# Patient Record
Sex: Male | Born: 1990 | Race: White | Hispanic: No | Marital: Single | Smoking: Current every day smoker
Health system: Southern US, Community
[De-identification: ages and names within clinical notes are randomized; demographics above are authoritative.]

## PROBLEM LIST (undated history)

## (undated) HISTORY — PX: FRACTURE SURGERY: SHX138

---
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
## 2010-08-04 NOTE — Consult Note (Signed)
[place]. [facility]  Patient:    Sergio Wright, Sergio Wright Visit Number: [id number] MRN: [id number]          Service Type: EXP Location: [facility] Attending Physician:  Doug Sou Dictated by:   Cammy Copa, M.D. Admit Date:  12/02/2000                            Consultation Report  CHIEF COMPLAINT:  Left wrist pain.  HISTORY OF PRESENT ILLNESS:  Sergio Wright is a 20-year-old child who was playing football tonight and he fell on his outstretched left wrist.  Had immediate onset of pain and swelling.  Presents now for operative management.  PAST MEDICAL HISTORY:  Noncontributory.  Patient did have a ______ fracture of the left wrist same time last year.  PAST SURGICAL HISTORY:  Noncontributory.  CURRENT MEDICATIONS:  None.  ALLERGIES:  No known drug allergies.  PHYSICAL EXAMINATION  GENERAL:  Patient is alert and oriented x 3.  CHEST:  Clear to auscultation.  HEART:  Regular rate and rhythm.  ABDOMEN:  Benign.  EXTREMITIES:  Left elbow and shoulder have full range of motion.  Left wrist has swelling and volarly angulated deformity to the wrist.  He has intact ______ function as well as intact radial pulse.  LABORATORIES:  Radiographs demonstrate 20 degree angulated distal radius fracture which is about 3 cm proximal to the growth plate.  IMPRESSION:  Dorsally angulated distal radius fracture.  PLAN:  Patient ate banana pudding at 8:30 tonight.  Currently it is 10:00.  I believe a conscious sedation would be unsafe at this time, therefore, I am going to put him into a long arm splint and plan for an elective reduction on Thursday.  I need to give the patient a prescription for Tylenol No. 3 with codeine.  I will see him back.  I will post him and I will call the father to let him know when and where the case will be done. Dictated by:   Cammy Copa, M.D. Attending Physician:  Doug Sou DD:  12/02/00 TD:  12/03/00 Job:  [id number]

## 2010-08-04 NOTE — Op Note (Signed)
[place]. [facility]  Patient:    MENA, LIENAU Visit Number: [id number] MRN: [id number]          Service Type: DSU Location: [facility] Attending Physician:  Burnard Bunting Dictated by:   Cammy Copa, M.D. Proc. Date: 12/05/00 Admit Date:  12/05/2000                             Operative Report  PREOPERATIVE DIAGNOSIS:  Left distal radius fracture, dorsally displaced.  POSTOPERATIVE DIAGNOSIS:  Left distal radius fracture, dorsally displaced.  PROCEDURE:  Closed reduction and casting of left distal radius fracture.  SURGEON:  Eric L. August Saucer, M.D.  ANESTHESIA:  General endotracheal.  ESTIMATED BLOOD LOSS:  None.  DESCRIPTION OF PROCEDURE:  Mr. Gaunt was brought to the operating room, where general anesthesia was induced.  The left wrist was then manipulated into better alignment.  The fracture was reduced to near-anatomic position. Preoperative angulation of 20 degrees was reduced to about 0 degrees.  The patient was then placed into a well-padded long-arm cast.  He was extubated and transferred to the recovery room in stable condition.  Fluoroscopy in the AP and lateral planes confirmed a good reduction. Dictated by:   Cammy Copa, M.D. Attending Physician:  Burnard Bunting DD:  12/05/00 TD:  12/05/00 Job: [id number]

## 2015-05-03 ENCOUNTER — Encounter: Payer: Self-pay | Admitting: Pediatrics

## 2015-05-03 ENCOUNTER — Ambulatory Visit (INDEPENDENT_AMBULATORY_CARE_PROVIDER_SITE_OTHER): Payer: Managed Care, Other (non HMO) | Admitting: Pediatrics

## 2015-05-03 VITALS — BP 134/89 | HR 87 | Temp 98.7°F | Ht 75.0 in | Wt 325.4 lb

## 2015-05-03 DIAGNOSIS — M545 Low back pain, unspecified: Secondary | ICD-10-CM

## 2015-05-03 DIAGNOSIS — R1030 Lower abdominal pain, unspecified: Secondary | ICD-10-CM | POA: Diagnosis not present

## 2015-05-03 DIAGNOSIS — R1031 Right lower quadrant pain: Secondary | ICD-10-CM

## 2015-05-03 DIAGNOSIS — Z6841 Body Mass Index (BMI) 40.0 and over, adult: Secondary | ICD-10-CM | POA: Insufficient documentation

## 2015-05-03 DIAGNOSIS — R1032 Left lower quadrant pain: Secondary | ICD-10-CM

## 2015-05-03 LAB — POCT UA - MICROSCOPIC ONLY
Bacteria, U Microscopic: NEGATIVE
Casts, Ur, LPF, POC: NEGATIVE
Crystals, Ur, HPF, POC: NEGATIVE
Epithelial cells, urine per micros: NEGATIVE
Mucus, UA: NEGATIVE
WBC, UR, HPF, POC: NEGATIVE
YEAST UA: NEGATIVE

## 2015-05-03 LAB — POCT URINALYSIS DIPSTICK
Bilirubin, UA: NEGATIVE
Glucose, UA: NEGATIVE
KETONES UA: NEGATIVE
LEUKOCYTES UA: NEGATIVE
Nitrite, UA: NEGATIVE
PH UA: 6
Spec Grav, UA: 1.02
Urobilinogen, UA: NEGATIVE

## 2015-05-03 MED ORDER — IBUPROFEN 800 MG PO TABS: 800.0000 mg | ORAL_TABLET | Freq: Three times a day (TID) | ORAL | Status: DC | PRN

## 2015-05-03 MED ORDER — CYCLOBENZAPRINE HCL 10 MG PO TABS: 10.0000 mg | ORAL_TABLET | Freq: Three times a day (TID) | ORAL | Status: DC | PRN

## 2015-05-03 NOTE — Progress Notes (Signed)
Subjective:    Patient ID: Sergio Wright, male    DOB: January 11, 1991, 25 y.o.   MRN: [id number]  CC: Back Pain and New Patient (Initial Visit)   HPI: Sergio Wright is a 25 y.o. 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, cutting logs, blowing leaves Has had back pain for past two days since then No pain going down legs Movement makes pain in back worse Always has some pressure in groin Pain mostly in the middle Pooping every day No incontinence No dysuria, no discharge Pain radiates down into groin No pain with peeing Appetite has been normal Minute clinic yesterday got muscle relaxers Lifting made it worse 800mg  mg ibuprofen three times a day Has noticed no difference past 24h with ibuprofen/muscle relaxant If he sits still has no pain, with movement has pain   Depression screen PHQ 2/9 05/03/2015  Decreased Interest 0  Down, Depressed, Hopeless 0  PHQ - 2 Score 0     Relevant past medical, surgical, family and social history reviewed and updated as indicated. Interim medical history since our last visit reviewed. Allergies and medications reviewed and updated.    ROS: All systems negative other than what is in HPI   Past Medical History Patient Active Problem List   Diagnosis Date Noted  . BMI 40.0-44.9, adult ([place]) 05/03/2015   Family History  Problem Relation Age of Onset  . Kidney Stones Father     Social History   Social History  . Marital Status: Single    Spouse Name: N/A  . Number of Children: N/A  . Years of Education: N/A   Occupational History  . Not on file.   Social History Main Topics  . Smoking status: Current Every Day Smoker -- 1.00 packs/day    Types: Cigarettes  . Smokeless tobacco: Not on file  . Alcohol Use: 7.2 oz/week    12 Cans of beer per week  . Drug Use: No  . Sexual Activity: Not on file   Other Topics Concern  . Not on file   Social History Narrative  . No narrative  on file     Current Outpatient Prescriptions  Medication Sig Dispense Refill  . ibuprofen (ADVIL,MOTRIN) 800 MG tablet     . orphenadrine (NORFLEX) 100 MG tablet      No current facility-administered medications for this visit.        Objective:    BP 134/89 mmHg  Pulse 87  Temp(Src) 98.7 F (37.1 C) (Oral)  Ht 6\' 3"  (1.905 m)  Wt 325 lb 6.4 oz (147.6 kg)  BMI 40.67 kg/m2  Wt Readings from Last 3 Encounters:  05/03/15 325 lb 6.4 oz (147.6 kg)     Gen: NAD, alert, cooperative with exam, NCAT EYES: EOMI, no scleral injection or icterus ENT:  OP without erythema LYMPH: no cervical LAD CV: NRRR, normal S1/S2, no murmur, distal pulses 2+ b/l Resp: CTABL, no wheezes, normal WOB Abd: +BS, soft, NTND. no guarding or rebound Ext: No edema, warm Neuro: Alert and oriented, strength equal b/l UE and LE, coordination grossly normal MSK: back pain, bilateral lower back, with twisting bilaterally, pain in back with slight forward flexion GU: normal ext male genitalia, testes hanging normal, mildly tender with palpation, no redness, no swelling, no palpable masses, no hernias     Assessment & Plan:    Phyllis was seen today for back pain and new patient (initial visit). Most  likely musculoskeletal pain. Gave back exercises, discussed symptomatic relief. Will test urine. Normal GU exam. If pain in testicles continues, will refer to urology. Not able to work with back pain as must be able to lift 50 lbs, wrote out of work until 05/13/2015 to give time for back pain to resolve.  Diagnoses and all orders for this visit:  Bilateral low back pain without sciatica -     ibuprofen (ADVIL,MOTRIN) 800 MG tablet; Take 1 tablet (800 mg total) by mouth every 8 (eight) hours as needed. -     cyclobenzaprine (FLEXERIL) 10 MG tablet; Take 1 tablet (10 mg total) by mouth 3 (three) times daily as needed for muscle spasms. -     POCT UA - Microscopic Only -     POCT urinalysis dipstick  Bilateral  groin pain -     POCT UA - Microscopic Only -     POCT urinalysis dipstick   Follow up plan: Return if symptoms worsen or fail to improve.  Assunta Found, MD [facility] 05/03/2015, 3:43 PM

## 2015-05-03 NOTE — Patient Instructions (Signed)

## 2015-05-06 ENCOUNTER — Telehealth: Payer: Self-pay | Admitting: Pediatrics

## 2015-05-06 NOTE — Telephone Encounter (Signed)
Patient aware of results.

## 2015-05-09 ENCOUNTER — Ambulatory Visit (INDEPENDENT_AMBULATORY_CARE_PROVIDER_SITE_OTHER): Payer: Managed Care, Other (non HMO) | Admitting: Pediatrics

## 2015-05-09 ENCOUNTER — Encounter: Payer: Self-pay | Admitting: Pediatrics

## 2015-05-09 VITALS — BP 135/84 | HR 106 | Temp 98.0°F | Ht 75.0 in | Wt 332.6 lb

## 2015-05-09 DIAGNOSIS — R1030 Lower abdominal pain, unspecified: Secondary | ICD-10-CM

## 2015-05-09 DIAGNOSIS — Z113 Encounter for screening for infections with a predominantly sexual mode of transmission: Secondary | ICD-10-CM | POA: Diagnosis not present

## 2015-05-09 DIAGNOSIS — R350 Frequency of micturition: Secondary | ICD-10-CM

## 2015-05-09 DIAGNOSIS — Z6841 Body Mass Index (BMI) 40.0 and over, adult: Secondary | ICD-10-CM

## 2015-05-09 LAB — POCT URINALYSIS DIPSTICK
BILIRUBIN UA: NEGATIVE
GLUCOSE UA: NEGATIVE
KETONES UA: NEGATIVE
Leukocytes, UA: NEGATIVE
Nitrite, UA: NEGATIVE
Protein, UA: NEGATIVE
RBC UA: NEGATIVE
Spec Grav, UA: 1.01
UROBILINOGEN UA: NEGATIVE
pH, UA: 7.5

## 2015-05-09 LAB — POCT UA - MICROSCOPIC ONLY
Bacteria, U Microscopic: NEGATIVE
CASTS, UR, LPF, POC: NEGATIVE
CRYSTALS, UR, HPF, POC: NEGATIVE
Epithelial cells, urine per micros: NEGATIVE
MUCUS UA: NEGATIVE
RBC, urine, microscopic: NEGATIVE
WBC, Ur, HPF, POC: NEGATIVE
YEAST UA: NEGATIVE

## 2015-05-09 LAB — POCT GLYCOSYLATED HEMOGLOBIN (HGB A1C): HEMOGLOBIN A1C: 5

## 2015-05-09 NOTE — Progress Notes (Signed)
Subjective:    Patient ID: Sergio Wright, male    DOB: Oct 24, 1990, 25 y.o.   MRN: [id number]  CC: Groin Pain   HPI: Sergio Wright is a 25 y.o. male presenting for Groin Pain  Back pain is better Noticing pressure in his bladder more Going to the bathroom more often, sometimes more than once in an hour This has been worsening since Saturday No new sexual partners for past 6 months Last STI screen was at 25yo No penile discharge, no dysuria, just the pressure in his bladder Drinks something and feels like he needs to urinate immediately   Depression screen [facility] 2/9 05/03/2015  Decreased Interest 0  Down, Depressed, Hopeless 0  PHQ - 2 Score 0     Relevant past medical, surgical, family and social history reviewed and updated as indicated. Interim medical history since our last visit reviewed. Allergies and medications reviewed and updated.    ROS: Per HPI unless specifically indicated above  History  Smoking status  . Current Every Day Smoker -- 1.00 packs/day  . Types: Cigarettes  Smokeless tobacco  . Not on file    Past Medical History Patient Active Problem List   Diagnosis Date Noted  . BMI 40.0-44.9, adult ([place]) 05/03/2015    Current Outpatient Prescriptions  Medication Sig Dispense Refill  . cyclobenzaprine (FLEXERIL) 10 MG tablet Take 1 tablet (10 mg total) by mouth 3 (three) times daily as needed for muscle spasms. 60 tablet 0  . ibuprofen (ADVIL,MOTRIN) 800 MG tablet Take 1 tablet (800 mg total) by mouth every 8 (eight) hours as needed. 60 tablet 0   No current facility-administered medications for this visit.       Objective:    BP 135/84 mmHg  Pulse 106  Temp(Src) 98 F (36.7 C) (Oral)  Ht _0  (1.905 m)  Wt 332 lb 9.6 oz (150.866 kg)  BMI 41.57 kg/m2  Wt Readings from Last 3 Encounters:  05/09/15 332 lb 9.6 oz (150.866 kg)  05/03/15 325 lb 6.4 oz (147.6 kg)     Gen: NAD, alert, cooperative with exam, NCAT EYES: EOMI, no scleral  injection or icterus ENT:  OP without erythema LYMPH: no cervical LAD CV: NRRR, normal S1/S2, no murmur, distal pulses 2+ b/l Resp: CTABL, no wheezes, normal WOB Abd: +BS, soft, NTND. no guarding or organomegaly, no CVA tenderness Ext: No edema, warm Neuro: Alert and oriented, strength equal b/l UE and LE, coordination grossly normal MSK: normal muscle bulk     Assessment & Plan:    Sergio Wright was seen today for groin pain and urinary frequency of unclear origin. Exam normal. Denies STI exposure recently, urine unrevealing. Will send below labs, refer to urology.  Diagnoses and all orders for this visit:  Groin pain, unspecified laterality -     POCT urinalysis dipstick -     POCT UA - Microscopic Only -     GC/Chlamydia Probe Amp -     RPR -     BMP8+EGFR -     HIV antibody -     Urine culture -     Ambulatory referral to Urology  Frequency of urination -     POCT urinalysis dipstick -     POCT UA - Microscopic Only -     GC/Chlamydia Probe Amp -     RPR -     BMP8+EGFR -     HIV antibody -     Urine culture -  POCT glycosylated hemoglobin (Hb A1C)  Routine screening for STI (sexually transmitted infection) -     GC/Chlamydia Probe Amp -     RPR -     HIV antibody  BMI 40.0-44.9, adult ([facility]) -     POCT glycosylated hemoglobin (Hb A1C)     Follow up plan: Return in about 4 weeks (around 06/06/2015).  Assunta Found, MD [facility] 05/09/2015, 3:41 PM

## 2015-05-10 LAB — HIV ANTIBODY (ROUTINE TESTING W REFLEX): HIV Screen 4th Generation wRfx: NONREACTIVE

## 2015-05-10 LAB — BMP8+EGFR
BUN/Creatinine Ratio: 13 (ref 8–19)
BUN: 11 mg/dL (ref 6–20)
CO2: 22 mmol/L (ref 18–29)
CREATININE: 0.83 mg/dL (ref 0.76–1.27)
Calcium: 9.9 mg/dL (ref 8.7–10.2)
Chloride: 98 mmol/L (ref 96–106)
GFR calc Af Amer: 142 mL/min/{1.73_m2} (ref >59)
GFR calc non Af Amer: 123 mL/min/{1.73_m2} (ref >59)
GLUCOSE: 90 mg/dL (ref 65–99)
POTASSIUM: 5 mmol/L (ref 3.5–5.2)
Sodium: 138 mmol/L (ref 134–144)

## 2015-05-10 LAB — RPR: RPR: NONREACTIVE

## 2015-05-11 LAB — GC/CHLAMYDIA PROBE AMP
Chlamydia trachomatis, NAA: NEGATIVE
NEISSERIA GONORRHOEAE BY PCR: NEGATIVE

## 2015-05-12 LAB — URINE CULTURE

## 2015-05-14 ENCOUNTER — Encounter (INDEPENDENT_AMBULATORY_CARE_PROVIDER_SITE_OTHER): Payer: Managed Care, Other (non HMO) | Admitting: Nurse Practitioner

## 2015-06-08 ENCOUNTER — Ambulatory Visit: Payer: Managed Care, Other (non HMO) | Admitting: Pediatrics

## 2015-06-09 ENCOUNTER — Encounter: Payer: Self-pay | Admitting: Pediatrics

## 2015-06-14 ENCOUNTER — Ambulatory Visit: Payer: Self-pay | Admitting: Urology

## 2017-06-05 ENCOUNTER — Ambulatory Visit (INDEPENDENT_AMBULATORY_CARE_PROVIDER_SITE_OTHER): Payer: Managed Care, Other (non HMO) | Admitting: Family Medicine

## 2017-06-05 ENCOUNTER — Encounter: Payer: Self-pay | Admitting: Family Medicine

## 2017-06-05 VITALS — BP 134/81 | HR 91 | Temp 98.7°F | Ht 75.0 in | Wt 330.0 lb

## 2017-06-05 DIAGNOSIS — J309 Allergic rhinitis, unspecified: Secondary | ICD-10-CM

## 2017-06-05 MED ORDER — FLUTICASONE PROPIONATE 50 MCG/ACT NA SUSP: 1.0000 | Freq: Two times a day (BID) | NASAL | 6 refills | Status: DC | PRN

## 2017-06-05 NOTE — Progress Notes (Signed)
BP 134/81   Pulse 91   Temp 98.7 F (37.1 C) (Oral)   Ht 6\' 3"  (1.905 m)   Wt (!) 330 lb (149.7 kg)   BMI 41.25 kg/m    Subjective:    Patient ID: Sergio Wright, male    DOB: [birth year], 27 y.o.   MRN: [id number]  HPI: Sergio Wright is a 27 y.o. male presenting on 06/05/2017 for Allergic Rhinitis  (sinus congestion, drainage, headache, sore throat, cough; taking Claritin, Nyquil)   HPI  Pt presents with sinus congestion, HA, and nonproductive cough x5 days. Stated he is also sneezing and having a lot of allergies. He also notes some wheezing. Denies any fevers or SOB or chest tightness. Pt is a current everyday smoker from 1/2- 1 pack per day and states he knows this makes his allergies and wheezing worse, but he is not ready to quit. He is taking Claritin daily and has taken Nyquil for this illness which has only helped because it has put him to sleep. He states he feels he is improving today. Denies any exposure to sick contacts.   Relevant past medical, surgical, family and social history reviewed and updated as indicated. Interim medical history since our last visit reviewed. Allergies and medications reviewed and updated.  Review of Systems  Constitutional: Negative for activity change, appetite change, chills and fever.  HENT: Positive for congestion, postnasal drip, rhinorrhea, sinus pressure, sinus pain and sneezing. Negative for ear discharge and ear pain.   Eyes: Negative for pain and itching.  Respiratory: Positive for cough and wheezing. Negative for chest tightness and shortness of breath.   Cardiovascular: Negative for chest pain.  Gastrointestinal: Negative for abdominal pain, diarrhea, nausea and vomiting.  Genitourinary: Negative for difficulty urinating, dysuria and urgency.  Neurological: Positive for headaches. Negative for light-headedness.    Per HPI unless specifically indicated above        Objective:    BP 134/81   Pulse 91   Temp 98.7 F (37.1  C) (Oral)   Ht 6\' 3"  (1.905 m)   Wt (!) 330 lb (149.7 kg)   BMI 41.25 kg/m   Wt Readings from Last 3 Encounters:  06/05/17 (!) 330 lb (149.7 kg)  05/09/15 (!) 332 lb 9.6 oz (150.9 kg)  05/03/15 (!) 325 lb 6.4 oz (147.6 kg)    Physical Exam  Constitutional: He appears well-developed and well-nourished.  HENT:  Right Ear: Tympanic membrane, external ear and ear canal normal.  Left Ear: Tympanic membrane, external ear and ear canal normal.  Nose: Rhinorrhea present. Right sinus exhibits maxillary sinus tenderness and frontal sinus tenderness. Left sinus exhibits maxillary sinus tenderness and frontal sinus tenderness.  Mouth/Throat: Mucous membranes are normal. Posterior oropharyngeal erythema present.  Cardiovascular: Normal rate, regular rhythm and normal heart sounds.  Pulmonary/Chest: Effort normal and breath sounds normal. He has no wheezes. He has no rales.  Lymphadenopathy:    He has no cervical adenopathy.  Nursing note and vitals reviewed.       Assessment & Plan:   Problem List Items Addressed This Visit    None    Visit Diagnoses    Allergic sinusitis    -  Primary   Relevant Medications   loratadine (CLARITIN) 10 MG tablet      Order Flonase for allergic sinusitis. Encourage smoking cessation. Instruct patient to continue using Claritin. Also instruct pt to use saline spray and Mucinex.  Follow up plan: Return if symptoms worsen or fail  to improve.  Counseling provided for all of the vaccine components No orders of the defined types were placed in this encounter.   Patient seen and examined with Chaney Malling PA student, agree with assessment and plan above. Caryl Pina, MD [facility] 06/05/2017, 5:23 PM

## 2018-04-09 ENCOUNTER — Encounter: Payer: Self-pay | Admitting: Family Medicine

## 2018-04-09 ENCOUNTER — Ambulatory Visit (INDEPENDENT_AMBULATORY_CARE_PROVIDER_SITE_OTHER): Payer: 59 | Admitting: Family Medicine

## 2018-04-09 VITALS — BP 135/84 | HR 97 | Temp 97.3°F | Ht 75.0 in | Wt 361.8 lb

## 2018-04-09 DIAGNOSIS — S39012A Strain of muscle, fascia and tendon of lower back, initial encounter: Secondary | ICD-10-CM

## 2018-04-09 MED ORDER — TIZANIDINE HCL 2 MG PO CAPS: 2.0000 mg | ORAL_CAPSULE | Freq: Three times a day (TID) | ORAL | 1 refills | Status: DC

## 2018-04-09 NOTE — Progress Notes (Signed)
BP 135/84   Pulse 97   Temp (!) 97.3 F (36.3 C) (Oral)   Ht 6\' 3"  (1.905 m)   Wt (!) 164.1 kg   BMI 45.22 kg/m    Subjective:    Patient ID: Sergio Wright, male    DOB: 16-Jul-1990, 28 y.o.   MRN: [id number]  HPI: Sergio Wright is a 28 y.o. male presenting on 04/09/2018 for Back Pain (Patient states he was cleaning up some metal Monday and has been having pain since.) and Testicle Pain   HPI Patient is coming in for lower back pain x 2-3 days. 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 His lower back started aching on Monday morning and has gotten worse today. He has taken Goody's powder with no relief. The pain shoots "inward" and forward, making both testicles feel pressure. Denies any urinary or bowel incontinence or symptoms. Denies any shooting pain up his spine or down his legs. Denies loss of motion, numbness, tingling, or decreased strength.  Patient denies any pain or blood with urination or any bulges or masses in his testicles  Relevant past medical, surgical, family and social history reviewed and updated as indicated. Interim medical history since our last visit reviewed. Allergies and medications reviewed and updated.  Review of Systems  Constitutional: Negative for chills and fever.  Respiratory: Negative for cough and shortness of breath.   Cardiovascular: Negative for chest pain.  Gastrointestinal: Negative for abdominal pain, constipation, diarrhea, nausea and vomiting.  Genitourinary: Negative for difficulty urinating and scrotal swelling. Testicular pain: pressure.  Musculoskeletal: Positive for back pain (lower, middle to right-side).  Neurological: Negative for dizziness and weakness.    Per HPI unless specifically indicated above   Allergies as of 04/09/2018   No Known Allergies     Medication List       Accurate as of April 09, 2018  2:04 PM. Always use your most recent med list.        fluticasone 50 MCG/ACT nasal  spray Commonly known as:  FLONASE Place 1 spray into both nostrils 2 (two) times daily as needed for allergies or rhinitis.   loratadine 10 MG tablet Commonly known as:  CLARITIN Take 10 mg by mouth daily.          Objective:    BP 135/84   Pulse 97   Temp (!) 97.3 F (36.3 C) (Oral)   Ht 6\' 3"  (1.905 m)   Wt (!) 164.1 kg   BMI 45.22 kg/m   Wt Readings from Last 3 Encounters:  04/09/18 (!) 164.1 kg  06/05/17 (!) 149.7 kg  05/09/15 (!) 150.9 kg    Physical Exam Cardiovascular:     Rate and Rhythm: Normal rate and regular rhythm.     Heart sounds: Normal heart sounds.  Pulmonary:     Breath sounds: Normal breath sounds.  Genitourinary:    Scrotum/Testes: Normal.     Comments: Negative for hernias Musculoskeletal: Normal range of motion.        General: No swelling or deformity. Tenderness: back pain.     Comments: Lower back pain with straight leg raise, no radiation       Assessment & Plan:   Problem List Items Addressed This Visit    None    Visit Diagnoses    Lumbar strain, initial encounter    -  Primary   Relevant Medications   tizanidine (ZANAFLEX) 2 MG capsule  Follow up plan:  Continue to stretch the affected area, use a heating pad and keep moving. Use Ibuprofen and reduce the use of Goody's powder. Advised to not lift more than 30 pounds for the next week.  Return if symptoms worsen or fail to improve.  Counseling provided for all of the vaccine components No orders of the defined types were placed in this encounter.   [place], PA-[facility] 04/09/2018, 2:04 PM  Patient seen and examined with PA student, agree with assessment plan above. Caryl Pina, MD [facility] 04/14/2018, 7:44 AM

## 2018-08-22 ENCOUNTER — Other Ambulatory Visit: Payer: Self-pay

## 2018-08-22 ENCOUNTER — Ambulatory Visit (INDEPENDENT_AMBULATORY_CARE_PROVIDER_SITE_OTHER): Payer: 59 | Admitting: Family Medicine

## 2018-08-22 ENCOUNTER — Encounter: Payer: Self-pay | Admitting: Family Medicine

## 2018-08-22 VITALS — BP 131/79 | HR 67 | Temp 97.1°F | Ht 75.0 in | Wt 348.4 lb

## 2018-08-22 DIAGNOSIS — R1900 Intra-abdominal and pelvic swelling, mass and lump, unspecified site: Secondary | ICD-10-CM

## 2018-08-22 NOTE — Progress Notes (Signed)
BP 131/79   Pulse 67   Temp (!) 97.1 F (36.2 C) (Oral)   Ht 6\' 3"  (1.905 m)   Wt (!) 348 lb 6.4 oz (158 kg)   BMI 43.55 kg/m    Subjective:   Patient ID: Sergio Wright, male    DOB: 19-Feb-1991, 28 y.o.   MRN: [id number]  HPI: Sergio Wright is a 28 y.o. male presenting on 08/22/2018 for Hernia (Patient states he thinks he has a hernia that has been there x 1 day)   HPI Patient feels like he had a bulge that came out and he feels like it popped back in or something stuck back in.  It was on his left side of his abdominal wall.  He denies any pain with it but just felt like it was out yesterday and then it went back in.  He was just coming in to get it checked out today.  He does not feel like it is out today.  He does not feel any pain with it.  He denies any constipation or blood in his stool  Relevant past medical, surgical, family and social history reviewed and updated as indicated. Interim medical history since our last visit reviewed. Allergies and medications reviewed and updated.  Review of Systems  Constitutional: Negative for chills and fever.  Eyes: Negative for visual disturbance.  Respiratory: Negative for shortness of breath and wheezing.   Cardiovascular: Negative for chest pain and leg swelling.  Gastrointestinal: Negative for abdominal pain.  Musculoskeletal: Negative for back pain and gait problem.  Skin: Negative for rash.  All other systems reviewed and are negative.   Per HPI unless specifically indicated above   Allergies as of 08/22/2018   No Known Allergies     Medication List       Accurate as of August 22, 2018 12:08 PM. If you have any questions, ask your nurse or doctor.        STOP taking these medications   tizanidine 2 MG capsule Commonly known as:  Zanaflex Stopped by:  Fransisca Kaufmann Fidel Caggiano, MD     TAKE these medications   fluticasone 50 MCG/ACT nasal spray Commonly known as:  FLONASE Place 1 spray into both nostrils 2 (two) times  daily as needed for allergies or rhinitis.   loratadine 10 MG tablet Commonly known as:  CLARITIN Take 10 mg by mouth daily.        Objective:   BP 131/79   Pulse 67   Temp (!) 97.1 F (36.2 C) (Oral)   Ht 6\' 3"  (1.905 m)   Wt (!) 348 lb 6.4 oz (158 kg)   BMI 43.55 kg/m   Wt Readings from Last 3 Encounters:  08/22/18 (!) 348 lb 6.4 oz (158 kg)  04/09/18 (!) 361 lb 12.8 oz (164.1 kg)  06/05/17 (!) 330 lb (149.7 kg)    Physical Exam Vitals signs and nursing note reviewed.  Constitutional:      General: He is not in acute distress.    Appearance: He is well-developed. He is not diaphoretic.  Eyes:     General: No scleral icterus.    Conjunctiva/sclera: Conjunctivae normal.  Neck:     Thyroid: No thyromegaly.  Abdominal:     General: Abdomen is flat. Bowel sounds are normal.     Tenderness: There is no abdominal tenderness.     Hernia: No hernia is present.  Musculoskeletal: Normal range of motion.  Skin:    General: Skin  is warm and dry.     Findings: No rash.  Neurological:     Mental Status: He is alert and oriented to person, place, and time.     Coordination: Coordination normal.  Psychiatric:        Behavior: Behavior normal.       Assessment & Plan:   Problem List Items Addressed This Visit    None    Visit Diagnoses    Abdominal wall bulge    -  Primary   Patient felt like he had an abdominal wall bulge, reassurance, no sign of it today or hernia, gave warning signs for hernias       Follow up plan: Return if symptoms worsen or fail to improve.  Counseling provided for all of the vaccine components No orders of the defined types were placed in this encounter.   Caryl Pina, MD [facility] 08/22/2018, 12:08 PM

## 2018-12-08 ENCOUNTER — Other Ambulatory Visit: Payer: Self-pay

## 2018-12-08 ENCOUNTER — Encounter: Payer: Self-pay | Admitting: Family Medicine

## 2018-12-08 ENCOUNTER — Ambulatory Visit (INDEPENDENT_AMBULATORY_CARE_PROVIDER_SITE_OTHER): Payer: 59 | Admitting: Family Medicine

## 2018-12-08 DIAGNOSIS — S39012A Strain of muscle, fascia and tendon of lower back, initial encounter: Secondary | ICD-10-CM | POA: Insufficient documentation

## 2018-12-08 MED ORDER — PREDNISONE 20 MG PO TABS: ORAL_TABLET | ORAL | 0 refills | Status: DC

## 2018-12-08 NOTE — Progress Notes (Signed)
BP 129/88   Pulse 100   Temp 98.4 F (36.9 C) (Temporal)   Resp 20   Ht 6\' 3"  (1.905 m)   Wt (!) 373 lb 9.6 oz (169.5 kg)   SpO2 94%   BMI 46.70 kg/m    Subjective:   Patient ID: Sergio Wright, male    DOB: 03-01-1991, 28 y.o.   MRN: [id number]  HPI: Sergio Wright is a 28 y.o. male presenting on 12/08/2018 for Back Pain (lower- x 2 days NKI)   HPI Lower back pain Patient has been having lower back pain across his lower back in a bandlike area is been going on for 2 days.  He denies any known injury.  He says that has not been getting better or worse.  Has not used anything over-the-counter for it except Tylenol.  He said the Tylenol did help some but is just not overall getting better.  Is something that is fall off and on before but this time it is been about 2 days ago is been worse today.  He says it is worse when he is up and getting around and doing things.  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  Relevant past medical, surgical, family and social history reviewed and updated as indicated. Interim medical history since our last visit reviewed. Allergies and medications reviewed and updated.  Review of Systems  Constitutional: Negative for chills and fever.  Respiratory: Negative for shortness of breath and wheezing.   Cardiovascular: Negative for chest pain and leg swelling.  Musculoskeletal: Positive for back pain. Negative for arthralgias and gait problem.  Skin: Negative for rash.  All other systems reviewed and are negative.   Per HPI unless specifically indicated above   Allergies as of 12/08/2018   No Known Allergies     Medication List       Accurate as of December 08, 2018 12:01 PM. If you have any questions, ask your nurse or doctor.        fluticasone 50 MCG/ACT nasal spray Commonly known as: FLONASE Place 1 spray into both nostrils 2 (two) times  daily as needed for allergies or rhinitis.   loratadine 10 MG tablet Commonly known as: CLARITIN Take 10 mg by mouth daily.   predniSONE 20 MG tablet Commonly known as: DELTASONE 2 po at same time daily for 5 days Started by: Fransisca Kaufmann Roshni Burbano, MD        Objective:   BP 129/88   Pulse 100   Temp 98.4 F (36.9 C) (Temporal)   Resp 20   Ht 6\' 3"  (1.905 m)   Wt (!) 373 lb 9.6 oz (169.5 kg)   SpO2 94%   BMI 46.70 kg/m   Wt Readings from Last 3 Encounters:  12/08/18 (!) 373 lb 9.6 oz (169.5 kg)  08/22/18 (!) 348 lb 6.4 oz (158 kg)  04/09/18 (!) 361 lb 12.8 oz (164.1 kg)    Physical Exam Vitals signs and nursing note reviewed.  Constitutional:      General: He is not in acute distress.    Appearance: He is well-developed. He is not diaphoretic.  Eyes:     General: No scleral icterus.    Conjunctiva/sclera: Conjunctivae normal.  Neck:     Thyroid: No thyromegaly.  Musculoskeletal:     Lumbar back: He exhibits tenderness (Her low back  tenderness in a bandlike area.). He exhibits normal range of motion and no bony tenderness.  Skin:    General: Skin is warm and dry.     Findings: No rash.  Neurological:     Mental Status: He is alert and oriented to person, place, and time.     Coordination: Coordination normal.  Psychiatric:        Behavior: Behavior normal.     Assessment & Plan:   Problem List Items Addressed This Visit      Musculoskeletal and Integument   Lumbar strain, initial encounter   Relevant Medications   predniSONE (DELTASONE) 20 MG tablet    Course of prednisone  Follow up plan: Return if symptoms worsen or fail to improve.  Counseling provided for all of the vaccine components No orders of the defined types were placed in this encounter.   Caryl Pina, MD [facility] 12/08/2018, 12:01 PM

## 2019-02-06 ENCOUNTER — Ambulatory Visit (INDEPENDENT_AMBULATORY_CARE_PROVIDER_SITE_OTHER): Payer: 59 | Admitting: Family Medicine

## 2019-02-06 ENCOUNTER — Encounter: Payer: Self-pay | Admitting: Family Medicine

## 2019-02-06 DIAGNOSIS — F411 Generalized anxiety disorder: Secondary | ICD-10-CM

## 2019-02-06 MED ORDER — SERTRALINE HCL 50 MG PO TABS: 50.0000 mg | ORAL_TABLET | Freq: Every day | ORAL | 1 refills | Status: DC

## 2019-02-06 NOTE — Progress Notes (Signed)
**Note Sergio-Identified via Obfuscation**    Virtual Visit via telephone Note  I connected with Sergio Wright on 02/06/19 at 1325 by telephone and verified that I am speaking with the correct person using two identifiers. Sergio Wright is currently located at home and no other people are currently with her during visit. The provider, Fransisca Kaufmann Kendel Pesnell, MD is located in their office at time of visit.  Call ended at 1337  I discussed the limitations, risks, security and privacy concerns of performing an evaluation and management service by telephone and the availability of in person appointments. I also discussed with the patient that there may be a patient responsible charge related to this service. The patient expressed understanding and agreed to proceed.   History and Present Illness: Patient is calling in today with issues with anxiety.  He has had a little anxiety since he was young but this past year it has been increasing.  He is not sleeping as well and it has built up especially at night.  He is dealing with covid and his mother's cancer.  He has been more irritable and it has been a big issue for him recently.  He also has a new job and that has been building up. He denies any suicidal ideations or thoughts of hurting himself.    No diagnosis found.  Outpatient Encounter Medications as of 02/06/2019  Medication Sig  . fluticasone (FLONASE) 50 MCG/ACT nasal spray Place 1 spray into both nostrils 2 (two) times daily as needed for allergies or rhinitis.  Marland Kitchen loratadine (CLARITIN) 10 MG tablet Take 10 mg by mouth daily.  . predniSONE (DELTASONE) 20 MG tablet 2 po at same time daily for 5 days   No facility-administered encounter medications on file as of 02/06/2019.     Review of Systems  Constitutional: Negative for chills and fever.  Respiratory: Negative for shortness of breath and wheezing.   Cardiovascular: Negative for chest pain and leg swelling.  Skin: Negative for rash.  Psychiatric/Behavioral: Positive for  dysphoric mood and sleep disturbance. Negative for decreased concentration, self-injury and suicidal ideas. The patient is nervous/anxious.   All other systems reviewed and are negative.   Observations/Objective: Patient sounds comfortable and in no acute distress  Assessment and Plan: Problem List Items Addressed This Visit    None    Visit Diagnoses    GAD (generalized anxiety disorder)    -  Primary   Relevant Medications   sertraline (ZOLOFT) 50 MG tablet      Start zoloft and recheck   Follow Up Instructions:  Follow up in 4 weeks for anxiety and stress   I discussed the assessment and treatment plan with the patient. The patient was provided an opportunity to ask questions and all were answered. The patient agreed with the plan and demonstrated an understanding of the instructions.   The patient was advised to call back or seek an in-person evaluation if the symptoms worsen or if the condition fails to improve as anticipated.  The above assessment and management plan was discussed with the patient. The patient verbalized understanding of and has agreed to the management plan. Patient is aware to call the clinic if symptoms persist or worsen. Patient is aware when to return to the clinic for a follow-up visit. Patient educated on when it is appropriate to go to the emergency department.    I provided 12 minutes of non-face-to-face time during this encounter.    Worthy Rancher, MD

## 2019-03-09 ENCOUNTER — Encounter: Payer: Self-pay | Admitting: Family Medicine

## 2019-03-09 ENCOUNTER — Ambulatory Visit (INDEPENDENT_AMBULATORY_CARE_PROVIDER_SITE_OTHER): Payer: 59 | Admitting: Family Medicine

## 2019-03-09 DIAGNOSIS — F411 Generalized anxiety disorder: Secondary | ICD-10-CM | POA: Diagnosis not present

## 2019-03-09 MED ORDER — SERTRALINE HCL 50 MG PO TABS: 50.0000 mg | ORAL_TABLET | Freq: Every day | ORAL | 1 refills | Status: DC

## 2019-03-09 NOTE — Progress Notes (Signed)
**Note Sergio-Identified via Obfuscation**    Virtual Visit via telephone Note  I connected with Sergio Wright on 03/09/19 at 1105 by telephone and verified that I am speaking with the correct person using two identifiers. Sergio Wright is currently located at home and no other people are currently with her during visit. The provider, Fransisca Kaufmann Peytin Dechert, MD is located in their office at time of visit.  Call ended at 1113  I discussed the limitations, risks, security and privacy concerns of performing an evaluation and management service by telephone and the availability of in person appointments. I also discussed with the patient that there may be a patient responsible charge related to this service. The patient expressed understanding and agreed to proceed.   History and Present Illness: Patient is calling for anxiety  Patient is taking zoloft, patient is getting upset stomach some and is getting better and is taking with food and it is better.  His stomach is not horrible and he is not having panic attacks. Will continue current dose.  1. GAD (generalized anxiety disorder)     Outpatient Encounter Medications as of 03/09/2019  Medication Sig  . fluticasone (FLONASE) 50 MCG/ACT nasal spray Place 1 spray into both nostrils 2 (two) times daily as needed for allergies or rhinitis.  Marland Kitchen loratadine (CLARITIN) 10 MG tablet Take 10 mg by mouth daily.  . sertraline (ZOLOFT) 50 MG tablet Take 1 tablet (50 mg total) by mouth daily.  . [DISCONTINUED] sertraline (ZOLOFT) 50 MG tablet Take 1 tablet (50 mg total) by mouth daily.   No facility-administered encounter medications on file as of 03/09/2019.    Review of Systems  Constitutional: Negative for chills and fever.  Respiratory: Negative for shortness of breath and wheezing.   Cardiovascular: Negative for chest pain and leg swelling.  Musculoskeletal: Negative for back pain and gait problem.  Skin: Negative for rash.  Psychiatric/Behavioral: Negative for dysphoric mood, self-injury,  sleep disturbance and suicidal ideas. The patient is nervous/anxious.   All other systems reviewed and are negative.   Observations/Objective: Patient sounds comfortable and in no acute distress  Assessment and Plan: Problem List Items Addressed This Visit    None    Visit Diagnoses    GAD (generalized anxiety disorder)    -  Primary   Relevant Medications   sertraline (ZOLOFT) 50 MG tablet      Will continue current dose.  Patient seems to be very content with current dose Follow up plan: Return in about 6 months (around 09/07/2019), or if symptoms worsen or fail to improve, for Physical exam and recheck on anxiety.     I discussed the assessment and treatment plan with the patient. The patient was provided an opportunity to ask questions and all were answered. The patient agreed with the plan and demonstrated an understanding of the instructions.   The patient was advised to call back or seek an in-person evaluation if the symptoms worsen or if the condition fails to improve as anticipated.  The above assessment and management plan was discussed with the patient. The patient verbalized understanding of and has agreed to the management plan. Patient is aware to call the clinic if symptoms persist or worsen. Patient is aware when to return to the clinic for a follow-up visit. Patient educated on when it is appropriate to go to the emergency department.    I provided 8 minutes of non-face-to-face time during this encounter.    Worthy Rancher, MD

## 2019-04-27 ENCOUNTER — Encounter: Payer: Self-pay | Admitting: Family Medicine

## 2019-04-27 ENCOUNTER — Ambulatory Visit (INDEPENDENT_AMBULATORY_CARE_PROVIDER_SITE_OTHER): Payer: 59 | Admitting: Family Medicine

## 2019-04-27 ENCOUNTER — Other Ambulatory Visit: Payer: Self-pay

## 2019-04-27 VITALS — BP 133/87 | HR 93 | Temp 98.7°F | Ht 75.0 in | Wt 363.2 lb

## 2019-04-27 DIAGNOSIS — M5432 Sciatica, left side: Secondary | ICD-10-CM | POA: Diagnosis not present

## 2019-04-27 DIAGNOSIS — K4090 Unilateral inguinal hernia, without obstruction or gangrene, not specified as recurrent: Secondary | ICD-10-CM | POA: Diagnosis not present

## 2019-04-27 MED ORDER — PREDNISONE 20 MG PO TABS: ORAL_TABLET | ORAL | 0 refills | Status: DC

## 2019-04-27 NOTE — Progress Notes (Signed)
BP 133/87   Pulse 93   Temp 98.7 F (37.1 C) (Temporal)   Ht 6\' 3"  (1.905 m)   Wt (!) 363 lb 3.2 oz (164.7 kg)   SpO2 94%   BMI 45.40 kg/m    Subjective:   Patient ID: DAXX MARTINEAU, male    DOB: [birth year], 29 y.o.   MRN: [id number]  HPI: KYLLE FOSKETT is a 29 y.o. male presenting on 04/27/2019 for Hernia (LUQ- states he can feel it pop in and out - ongoing ) and Testicle Pain   HPI Patient says he feels a bulge down in his left groin causes testicle pain and sometimes he feels like his testicle is very tight and raises up on him on that side.  He has been having this over the past few months but it has increased in frequency and he feels like it will pop in and out.  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  He denies any bulge or pain right now and he says that it will pop in and out and cause him problems.  Patient also says over the past couple weeks he has developed nerve pain from his sciatica coming down the left back down his left leg that is been causing him issues.  Relevant past medical, surgical, family and social history reviewed and updated as indicated. Interim medical history since our last visit reviewed. Allergies and medications reviewed and updated.  Review of Systems  Constitutional: Negative for chills and fever.  Respiratory: Negative for shortness of breath and wheezing.   Cardiovascular: Negative for chest pain and leg swelling.  Gastrointestinal: Negative for abdominal distention, abdominal pain, blood in stool, constipation, diarrhea, nausea and vomiting.  Musculoskeletal: Positive for arthralgias and back pain.  Skin: Negative for rash.  All other systems reviewed and are negative.   Per HPI unless specifically indicated above   Allergies as of 04/27/2019   No Known Allergies     Medication List       Accurate as of April 27, 2019  2:10 PM. If you have any questions, ask your nurse or  doctor.        fluticasone 50 MCG/ACT nasal spray Commonly known as: FLONASE Place 1 spray into both nostrils 2 (two) times daily as needed for allergies or rhinitis.   loratadine 10 MG tablet Commonly known as: CLARITIN Take 10 mg by mouth daily.   predniSONE 20 MG tablet Commonly known as: DELTASONE 2 po at same time daily for 5 days Started by: Worthy Rancher, MD   sertraline 50 MG tablet Commonly known as: ZOLOFT Take 1 tablet (50 mg total) by mouth daily.        Objective:   BP 133/87   Pulse 93   Temp 98.7 F (37.1 C) (Temporal)   Ht 6\' 3"  (1.905 m)   Wt (!) 363 lb 3.2 oz (164.7 kg)   SpO2 94%   BMI 45.40 kg/m   Wt Readings from Last 3 Encounters:  04/27/19 (!) 363 lb 3.2 oz (164.7 kg)  12/08/18 (!) 373 lb 9.6 oz (169.5 kg)  08/22/18 (!) 348 lb 6.4 oz (158 kg)    Physical Exam Vitals and nursing note reviewed.  Constitutional:      General: He is not in acute distress.    Appearance: He is well-developed. He is not diaphoretic.  Eyes:     General:  No scleral icterus.    Conjunctiva/sclera: Conjunctivae normal.  Abdominal:     Hernia: A hernia is present. Hernia is present in the left inguinal area. There is no hernia in the right inguinal area.  Genitourinary:    Penis: Normal and circumcised.      Testes: Normal. Cremasteric reflex is present.        Right: Mass, tenderness or swelling not present.        Left: Mass, tenderness or swelling not present.  Skin:    General: Skin is warm and dry.     Findings: No rash.  Neurological:     Mental Status: He is alert and oriented to person, place, and time.     Coordination: Coordination normal.  Psychiatric:        Behavior: Behavior normal.       Assessment & Plan:   Problem List Items Addressed This Visit    None    Visit Diagnoses    Left inguinal hernia    -  Primary   Relevant Orders   Ambulatory referral to General Surgery   Left sciatic nerve pain        Patient has had  recurring bulge, may be able to faintly feel a hernia today but he says it will bulge out into his testicle frequently  Follow up plan: Return if symptoms worsen or fail to improve.  Counseling provided for all of the vaccine components Orders Placed This Encounter  Procedures  . Ambulatory referral to [place] Morell Mears, MD [place] 04/27/2019, 2:10 PM

## 2019-07-29 ENCOUNTER — Other Ambulatory Visit: Payer: Self-pay

## 2019-07-29 ENCOUNTER — Telehealth: Payer: Self-pay

## 2019-07-29 ENCOUNTER — Encounter: Payer: Self-pay | Admitting: Family Medicine

## 2019-07-29 ENCOUNTER — Ambulatory Visit (INDEPENDENT_AMBULATORY_CARE_PROVIDER_SITE_OTHER): Payer: 59 | Admitting: Family Medicine

## 2019-07-29 VITALS — BP 140/101 | HR 87 | Temp 98.0°F | Ht 74.5 in | Wt 358.0 lb

## 2019-07-29 DIAGNOSIS — M5432 Sciatica, left side: Secondary | ICD-10-CM | POA: Diagnosis not present

## 2019-07-29 DIAGNOSIS — S39012D Strain of muscle, fascia and tendon of lower back, subsequent encounter: Secondary | ICD-10-CM

## 2019-07-29 DIAGNOSIS — K4090 Unilateral inguinal hernia, without obstruction or gangrene, not specified as recurrent: Secondary | ICD-10-CM

## 2019-07-29 MED ORDER — PREDNISONE 20 MG PO TABS: ORAL_TABLET | ORAL | 0 refills | Status: DC

## 2019-07-29 NOTE — Progress Notes (Signed)
BP (!) 140/101   Pulse 87   Temp 98 F (36.7 C)   Ht 6' 2.5" (1.892 m)   Wt (!) 162.4 kg   SpO2 98%   BMI 45.35 kg/m    Subjective:   Patient ID: Sergio Wright, male    DOB: 04-12-1990, 29 y.o.   MRN: [id number]  HPI: Sergio Wright is a 29 y.o. male presenting on 07/29/2019 for Left Lower Back Pain and Left Leg Pain and Inguinal Hernia Follow Up.  1. Left Lower Back/Left Leg Pain:  Sergio Wright reports left lower back pain and left leg pain for approximately 3 months that has acutely worsened within the past 1-2 weeks. He describes the pain as a shooting pain that radiates down his entire left leg when he does certain movements (ex. Getting in a car, grabbing things off a shelf, getting up in a tractor). He states that it feels like his leg "locks up". He also says that his leg will go completely numb when he sits in certain chairs or in a warm bath. He has tried warm baths to help with the pain and this has brought some relief. He has been prescribed Prednisone and muscle relaxant(s) in the past. Prednisone was helpful but the muscle relaxant(s) did not seem to help.  2. Inguinal Hernia Follow Up:  Sergio Wright was diagnosed with a left inguinal hernia by Dr. Warrick Parisian in February and was supposed to be scheduled for surgery with [facility], but he has been unable to get in touch with them to schedule.  Relevant past medical, surgical, family and social history reviewed and updated as indicated. Interim medical history since our last visit reviewed.  Allergies and medications reviewed and updated.  Review of Systems  Constitutional: Positive for fatigue. Negative for fever and unexpected weight change.  Respiratory: Negative for shortness of breath.   Cardiovascular: Negative for chest pain.  Gastrointestinal: Negative for nausea and vomiting.  Musculoskeletal: Positive for back pain and myalgias.   Per HPI unless specifically indicated above  Allergies as of  07/29/2019   No Known Allergies     Medication List       Accurate as of Jul 29, 2019  8:49 AM. If you have any questions, ask your nurse or doctor.        STOP taking these medications   sertraline 50 MG tablet Commonly known as: ZOLOFT Stopped by: [name], MD     TAKE these medications   fluticasone 50 MCG/ACT nasal spray Commonly known as: FLONASE Place 1 spray into both nostrils 2 (two) times daily as needed for allergies or rhinitis.   loratadine 10 MG tablet Commonly known as: CLARITIN Take 10 mg by mouth daily as needed.   predniSONE 20 MG tablet Commonly known as: DELTASONE 2 po at same time daily for 5 days        Objective:   BP (!) 140/101   Pulse 87   Temp 98 F (36.7 C)   Ht 6' 2.5" (1.892 m)   Wt (!) 162.4 kg   SpO2 98%   BMI 45.35 kg/m   Wt Readings from Last 3 Encounters:  07/29/19 (!) 162.4 kg  04/27/19 (!) 164.7 kg  12/08/18 (!) 169.5 kg    Physical Exam Constitutional:      General: He is not in acute distress.    Appearance: Normal appearance. He is obese.  Cardiovascular:     Rate and Rhythm: Normal rate and  regular rhythm.     Comments: S1/S2 auscultated. Pulmonary:     Effort: Pulmonary effort is normal.     Breath sounds: Normal breath sounds. No wheezing, rhonchi or rales.  Musculoskeletal:     Lumbar back: Tenderness (lower right side) present. Normal range of motion. Negative right straight leg raise test and negative left straight leg raise test.  Skin:    General: Skin is warm and dry.  Neurological:     Mental Status: He is alert and oriented to person, place, and time.     Coordination: Coordination normal.     Gait: Gait normal.  Psychiatric:        Mood and Affect: Mood normal.        Behavior: Behavior normal.        Thought Content: Thought content normal.        Judgment: Judgment normal.    Assessment & Plan:   Problem List Items Addressed This Visit      Musculoskeletal and Integument    Lumbar strain, initial encounter - Primary   Relevant Medications   predniSONE (DELTASONE) 20 MG tablet    Other Visit Diagnoses    Left inguinal hernia       Left sciatic nerve pain       Relevant Medications   predniSONE (DELTASONE) 20 MG tablet   Other Relevant Orders   Ambulatory referral to Physical Therapy      1. Left Lower Back/Left Leg Pain:  Due to Sergio Wright symptoms, history, physical exam, and past MRI imaging results, this is likely a lumbar sprain along with some nerve impingement. Will prescribe a course of Prednisone to help calm down the inflammation and will refer to Physical Therapy to improve muscle strength and flexibility. Recommend stretching to help alleviate some of the muscle tightness that is likely contributing to the pain. Sergio Wright was agreeable to this plan.  2. Left Inguinal Hernia:  Will double check on referral to [facility]. Will have LPN call their office to see if Sergio Wright can be scheduled for his left inguinal hernia repair surgery.  Follow up plan: Return if symptoms worsen or fail to improve.  Orders Placed This Encounter  Procedures  . Ambulatory referral to Physical Therapy    Gaynelle Arabian, PA-S2 [place] 07/29/2019, 8:49 AM  Patient seen and examined with Gaynelle Arabian, PA student, agree with assessment and plan above.  The likely back pain is related to sciatica, will give anti-inflammatories and do physical therapy and if not improved from there we will consider imaging in the future. Caryl Pina, MD [facility] 08/04/2019, 9:12 PM

## 2019-07-29 NOTE — Telephone Encounter (Signed)
Left message for Sergio Wright at [place] to return call. A referral was created in Feb for inguinal hernia. Pt has not been scheduled at this time. He states that he has called twice. He did not get an answer and the other time he was on hold and had to hang up. They have yet to call him to schedule.  Pt's info left for Sergio Wright. Will await their return call.

## 2019-07-31 ENCOUNTER — Encounter: Payer: Self-pay | Admitting: Physical Therapy

## 2019-07-31 ENCOUNTER — Other Ambulatory Visit: Payer: Self-pay

## 2019-07-31 ENCOUNTER — Ambulatory Visit: Payer: 59 | Attending: Family Medicine | Admitting: Physical Therapy

## 2019-07-31 DIAGNOSIS — M5442 Lumbago with sciatica, left side: Secondary | ICD-10-CM | POA: Insufficient documentation

## 2019-07-31 DIAGNOSIS — G8929 Other chronic pain: Secondary | ICD-10-CM

## 2019-07-31 DIAGNOSIS — M6281 Muscle weakness (generalized): Secondary | ICD-10-CM | POA: Diagnosis present

## 2019-07-31 NOTE — Therapy (Signed)
[facility] [place], [place], [address] Phone: [phone]   Fax:  [phone]  Physical Therapy Evaluation  Patient Details  Name: Sergio Wright MRN: [id number] Date of Birth: 06/08/1990 Referring Provider (PT): Caryl Pina, MD   Encounter Date: 07/31/2019  PT End of Session - 07/31/19 1834    Visit Number  1    Number of Visits  12    Date for PT Re-Evaluation  09/18/19    Authorization Type  FOTO to be completed on visit 2; Progress note every 10th visit    PT Start Time  0900    PT Stop Time  0943    PT Time Calculation (min)  43 min    Activity Tolerance  Patient tolerated treatment well    Behavior During Therapy  [facility] for tasks assessed/performed       History reviewed. No pertinent past medical history.  History reviewed. No pertinent surgical history.  There were no vitals filed for this visit.   Subjective Assessment - 07/31/19 1825    Subjective  COVID-19 screening performed upon arrival.Patient arrives to physical therapy with reports of low back pain and numbness and tingling down left LE to toes that began insidiously in January 2021. Patient reports sharp, shooting pain comes and goes with certain movements; Constant pain and increase of numbness with activities such as getting into a car or bathtub or getting onto a tractor. Patient reports pain at worst as 10/10 and pain at best as 0/10. Patient's goals are to decrease pain, improve movement, and improve ability to perform home and work activities.    Pertinent History  Inguinal hernia    Limitations  Standing;Walking;House hold activities;Sitting    Diagnostic tests  n/a    Patient Stated Goals  decrease pain    Currently in Pain?  No/denies    Pain Score  0-No pain    Pain Location  Back    Pain Orientation  Left;Lower    Pain Descriptors / Indicators  Shooting;Sharp    Pain Type  Chronic pain    Pain Radiating Towards  left toes    Pain  Onset  More than a month ago    Pain Frequency  Intermittent    Aggravating Factors   certain movements, sitting in the tub, getting in the car    Pain Relieving Factors  position changes    Effect of Pain on Daily Activities  pain with certain activities, can't pinpoint what causes pain         [facility] PT Assessment - 07/31/19 0001      Assessment   Medical Diagnosis  lumbar strain, left sciatic nerve pain    Referring Provider (PT)  Caryl Pina, MD    Onset Date/Surgical Date  --   January 2020   Next MD Visit  none    Prior Therapy  no      Precautions   Precautions  None      Restrictions   Weight Bearing Restrictions  No      Balance Screen   Has the patient fallen in the past 6 months  No    Has the patient had a decrease in activity level because of a fear of falling?   No    Is the patient reluctant to leave their home because of a fear of falling?   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   Level of Independence  Independent      Observation/Other Assessments   Focus on Therapeutic Outcomes (FOTO)   to be completed next visit       Deep Tendon Reflexes   DTR Assessment Site  Patella    Patella DTR  2+   bilaterally     ROM / Strength   AROM / PROM / Strength  AROM;Strength      AROM   AROM Assessment Site  Lumbar    Lumbar Flexion  14" finger tip to floor    Lumbar Extension  6 degrees (+) pain    Lumbar - Right Side Bend  22" finger tip to floor    Lumbar - Left Side Bend  21.5" finger tip to floor      Strength   Strength Assessment Site  Hip;Knee    Right/Left Hip  Right;Left    Right Hip Flexion  4+/5    Right Hip Extension  4/5    Right Hip ABduction  4/5    Left Hip Flexion  3+/5    Left Hip Extension  3-/5    Left Hip ABduction  3-/5    Right/Left Knee  Right;Left    Right Knee Flexion  4+/5    Right Knee Extension  5/5    Left Knee Flexion  4-/5    Left Knee Extension  4-/5       Palpation   SI assessment   equal leg lengths    Palpation comment  tendeness to bilateral QL L>R, slight increase of pain in left glute      Transfers   Transfers  Independent with all Transfers                  Objective measurements completed on examination: See above findings.              PT Education - 07/31/19 1833    Education Details  Draw in, supine marching, SKTC, nerve glide    Person(s) Educated  Patient    Methods  Explanation;Demonstration;Handout    Comprehension  Verbalized understanding;Returned demonstration          PT Long Term Goals - 07/31/19 1835      PT LONG TERM GOAL #1   Title  Patient will be independent with HEP    Time  6    Period  Weeks    Status  New      PT LONG TERM GOAL #2   Title  Patient will report a centralization of neurological symptoms to left glute or report no neurological symptoms to indicate no nerve irritation    Time  6    Period  Weeks    Status  New      PT LONG TERM GOAL #3   Title  Patient will demonstrate 4+/5 or greater left LE MMT to improve stability during functional tasks.    Time  6    Period  Weeks    Status  New      PT LONG TERM GOAL #4   Title  Patient will report ability to perform ADLs and work activities with low back pain less than or equal to 2/10.    Time  6    Period  Weeks    Status  New             Plan - 07/31/19 1854    Clinical Impression Statement  Patient is a 29 year  old male who presents to physical therapy with low back pain, left LE neurological symptoms to toes, and decreased left LE MMT that began about January 2020. Patient noted with normal DTR bilaterally. Patient tender to palpation to lumbar paraspinals, L QL and left glute. Patient and PT discussed plan of care and HEP to which patient reported understanding. Patient would benefit from skilled physical therapy to address deficits and address patient's goals.    Personal Factors and Comorbidities   Comorbidity 1;Time since onset of injury/illness/exacerbation    Comorbidities  inguinal hernia    Examination-Activity Limitations  Bathing;Sit;Stand;Transfers    Examination-Participation Restrictions  Driving    Stability/Clinical Decision Making  Stable/Uncomplicated    Clinical Decision Making  Low    Rehab Potential  Good    PT Frequency  2x / week    PT Duration  6 weeks    PT Treatment/Interventions  ADLs/Self Care Home Management;Cryotherapy;Electrical Stimulation;Moist Heat;Traction;Ultrasound;Neuromuscular re-education;Therapeutic exercise;Therapeutic activities;Functional mobility training;Stair training;Gait training;Patient/family education;Balance training;Manual techniques;Dry needling;Passive range of motion;Taping    PT Next Visit Plan  FOTO bike or nustep, core stabilization, left sciatic nerve flossing, modaliites PRN for pain relief.    PT Home Exercise Plan  see patient education section    Consulted and Agree with Plan of Care  Patient       Patient will benefit from skilled therapeutic intervention in order to improve the following deficits and impairments:  Decreased activity tolerance, Decreased strength, Decreased range of motion, Pain, Postural dysfunction  Visit Diagnosis: Chronic bilateral low back pain with left-sided sciatica - Plan: PT plan of care cert/re-cert  Muscle weakness (generalized) - Plan: PT plan of care cert/re-cert     Problem List Patient Active Problem List   Diagnosis Date Noted  . Lumbar strain, initial encounter 12/08/2018  . BMI 40.0-44.9, adult [facility]) 05/03/2015    Gabriela Eves, PT, DPT 07/31/2019, 7:09 PM  [facility] [address] [place], [place], [address] Phone: [phone]   Fax:  [phone]  Name: ALHASSANE SANSON MRN: [id number] Date of Birth: 21-Apr-1990

## 2019-08-04 ENCOUNTER — Ambulatory Visit: Payer: 59 | Admitting: Physical Therapy

## 2019-08-04 ENCOUNTER — Other Ambulatory Visit: Payer: Self-pay

## 2019-08-04 ENCOUNTER — Encounter: Payer: Self-pay | Admitting: Physical Therapy

## 2019-08-04 DIAGNOSIS — G8929 Other chronic pain: Secondary | ICD-10-CM

## 2019-08-04 DIAGNOSIS — M5442 Lumbago with sciatica, left side: Secondary | ICD-10-CM | POA: Diagnosis not present

## 2019-08-04 DIAGNOSIS — M6281 Muscle weakness (generalized): Secondary | ICD-10-CM

## 2019-08-04 NOTE — Therapy (Signed)
[facility] [place], [place], [address] Phone: [phone]   Fax:  [phone]  Physical Therapy Treatment  Patient Details  Name: Sergio Wright MRN: [id number] Date of Birth: [birth year] Referring Provider (PT): Caryl Pina, MD   Encounter Date: 08/04/2019  PT End of Session - 08/04/19 1744    Visit Number  2    Number of Visits  12    Date for PT Re-Evaluation  09/18/19    Authorization Type  Progress note every 10th visit    PT Start Time  1645    PT Stop Time  1732    PT Time Calculation (min)  47 min    Activity Tolerance  Patient tolerated treatment well    Behavior During Therapy  [facility] for tasks assessed/performed       History reviewed. No pertinent past medical history.  History reviewed. No pertinent surgical history.  There were no vitals filed for this visit.  Subjective Assessment - 08/04/19 1700    Subjective  COVID-19 screening performed upon arrival. Patient arrives feeling better than yesterday. Reports had more spasms with certain activities.    Pertinent History  Inguinal hernia    Limitations  Standing;Walking;House hold activities;Sitting    Diagnostic tests  n/a    Patient Stated Goals  decrease pain    Currently in Pain?  Yes   did not provide number on pain scale        [facility] PT Assessment - 08/04/19 0001      Assessment   Medical Diagnosis  lumbar strain, left sciatic nerve pain    Referring Provider (PT)  Caryl Pina, MD    Next MD Visit  none    Prior Therapy  no      Precautions   Precautions  None                    [facility] Adult PT Treatment/Exercise - 08/04/19 0001      Exercises   Exercises  Lumbar      Lumbar Exercises: Stretches   Lower Trunk Rotation  3 reps;30 seconds    Figure 4 Stretch  3 reps;30 seconds;Supine      Lumbar Exercises: Aerobic   Nustep  level 4 x10 mins      Lumbar Exercises: Supine   Clam  15 reps;3 seconds    Bent  Knee Raise  3 seconds;15 reps      Modalities   Modalities  Electrical Stimulation;Moist Heat      Moist Heat Therapy   Number Minutes Moist Heat  10 Minutes    Moist Heat Location  Lumbar Spine      Electrical Stimulation   Electrical Stimulation Location  left lower lumbar    Electrical Stimulation Action  IFC    Electrical Stimulation Parameters  80-150 hz x10 mins    Electrical Stimulation Goals  Pain;Tone                  PT Long Term Goals - 07/31/19 1835      PT LONG TERM GOAL #1   Title  Patient will be independent with HEP    Time  6    Period  Weeks    Status  New      PT LONG TERM GOAL #2   Title  Patient will report a centralization of neurological symptoms to left glute or report no neurological symptoms to indicate no nerve irritation    Time  6    Period  Weeks    Status  New      PT LONG TERM GOAL #3   Title  Patient will demonstrate 4+/5 or greater left LE MMT to improve stability during functional tasks.    Time  6    Period  Weeks    Status  New      PT LONG TERM GOAL #4   Title  Patient will report ability to perform ADLs and work activities with low back pain less than or equal to 2/10.    Time  6    Period  Weeks    Status  New            Plan - 08/04/19 1737    Clinical Impression Statement  Patient responded well to therapy session with no reports of spasming but with increased neural tension with left LE stretching. Patient demonstrated good form and technique with all TEs after explanation. Patient with normal response to modalities upon removal of modalities.    Personal Factors and Comorbidities  Comorbidity 1;Time since onset of injury/illness/exacerbation    Comorbidities  inguinal hernia    Examination-Activity Limitations  Bathing;Sit;Stand;Transfers    Examination-Participation Restrictions  Driving    Stability/Clinical Decision Making  Stable/Uncomplicated    Clinical Decision Making  Low    Rehab Potential  Good     PT Frequency  2x / week    PT Duration  6 weeks    PT Treatment/Interventions  ADLs/Self Care Home Management;Cryotherapy;Electrical Stimulation;Moist Heat;Traction;Ultrasound;Neuromuscular re-education;Therapeutic exercise;Therapeutic activities;Functional mobility training;Stair training;Gait training;Patient/family education;Balance training;Manual techniques;Dry needling;Passive range of motion;Taping    PT Next Visit Plan  bike or nustep, core stabilization, left sciatic nerve flossing, modaliites PRN for pain relief.    PT Home Exercise Plan  see patient education section    Consulted and Agree with Plan of Care  Patient       Patient will benefit from skilled therapeutic intervention in order to improve the following deficits and impairments:  Decreased activity tolerance, Decreased strength, Decreased range of motion, Pain, Postural dysfunction  Visit Diagnosis: Chronic bilateral low back pain with left-sided sciatica  Muscle weakness (generalized)     Problem List Patient Active Problem List   Diagnosis Date Noted  . Lumbar strain, initial encounter 12/08/2018  . BMI 40.0-44.9, adult [facility]) 05/03/2015    Gabriela Eves, PT, DPT 08/04/2019, 5:45 PM  [facility] [address] [place], [place], [address] Phone: [phone]   Fax:  [phone]  Name: Sergio Wright MRN: [id number] Date of Birth: September 08, 1990

## 2019-08-11 ENCOUNTER — Ambulatory Visit: Payer: 59 | Admitting: Physical Therapy

## 2019-08-11 ENCOUNTER — Other Ambulatory Visit: Payer: Self-pay

## 2019-08-11 ENCOUNTER — Encounter: Payer: Self-pay | Admitting: Physical Therapy

## 2019-08-11 DIAGNOSIS — M5442 Lumbago with sciatica, left side: Secondary | ICD-10-CM | POA: Diagnosis not present

## 2019-08-11 DIAGNOSIS — M6281 Muscle weakness (generalized): Secondary | ICD-10-CM

## 2019-08-11 DIAGNOSIS — G8929 Other chronic pain: Secondary | ICD-10-CM

## 2019-08-11 NOTE — Therapy (Signed)
[facility] [place], [place], [address] Phone: [phone]   Fax:  [phone]  Physical Therapy Treatment  Patient Details  Name: Sergio Wright MRN: [id number] Date of Birth: 1990/09/22 Referring Provider (PT): Caryl Pina, MD   Encounter Date: 08/11/2019  PT End of Session - 08/11/19 1746    Visit Number  3    Number of Visits  12    Date for PT Re-Evaluation  09/18/19    Authorization Type  Progress note every 10th visit    PT Start Time  1645    PT Stop Time  1730    PT Time Calculation (min)  45 min    Activity Tolerance  Patient tolerated treatment well    Behavior During Therapy  [facility] for tasks assessed/performed       History reviewed. No pertinent past medical history.  History reviewed. No pertinent surgical history.  There were no vitals filed for this visit.  Subjective Assessment - 08/11/19 1743    Subjective  COVID-19 screening performed upon arrival. Patient feeling good but states he has been feeling some symptoms down right LE to his glute.    Pertinent History  Inguinal hernia    Limitations  Standing;Walking;House hold activities;Sitting    Diagnostic tests  n/a    Patient Stated Goals  decrease pain    Currently in Pain?  Yes    Pain Score  1     Pain Location  Back    Pain Orientation  Left;Lower    Pain Descriptors / Indicators  Shooting    Pain Type  Chronic pain    Pain Onset  More than a month ago    Pain Frequency  Intermittent         [facility] PT Assessment - 08/11/19 0001      Assessment   Medical Diagnosis  lumbar strain, left sciatic nerve pain    Referring Provider (PT)  Caryl Pina, MD    Next MD Visit  none    Prior Therapy  no      Precautions   Precautions  None                    [facility] Adult PT Treatment/Exercise - 08/11/19 0001      Exercises   Exercises  Lumbar      Lumbar Exercises: Stretches   Passive Hamstring Stretch  Right;Left;3  reps;30 seconds    Lower Trunk Rotation  3 reps;30 seconds    Figure 4 Stretch  3 reps;30 seconds;Supine      Lumbar Exercises: Aerobic   Nustep  level 4 x12 mins      Lumbar Exercises: Standing   Wall Slides  20 reps;2 seconds      Lumbar Exercises: Supine   Straight Leg Raise  10 reps;3 seconds      Lumbar Exercises: Sidelying   Clam  Both;20 reps;3 seconds                  PT Long Term Goals - 07/31/19 1835      PT LONG TERM GOAL #1   Title  Patient will be independent with HEP    Time  6    Period  Weeks    Status  New      PT LONG TERM GOAL #2   Title  Patient will report a centralization of neurological symptoms to left glute or report no neurological symptoms to indicate no nerve irritation  Time  6    Period  Weeks    Status  New      PT LONG TERM GOAL #3   Title  Patient will demonstrate 4+/5 or greater left LE MMT to improve stability during functional tasks.    Time  6    Period  Weeks    Status  New      PT LONG TERM GOAL #4   Title  Patient will report ability to perform ADLs and work activities with low back pain less than or equal to 2/10.    Time  6    Period  Weeks    Status  New            Plan - 08/11/19 1748    Clinical Impression Statement  Patient was able to tolerate treatment well but with increase of pain and neurological symptoms to L LE with 90-90 marching. Exercise terminated. Patient otherwise responded very well to therapy session. Patient provided with HEP as he is unable to attend Thursday session. No modalities performed as pain rated at 1/10.    Personal Factors and Comorbidities  Comorbidity 1;Time since onset of injury/illness/exacerbation    Comorbidities  inguinal hernia    Examination-Activity Limitations  Bathing;Sit;Stand;Transfers    Examination-Participation Restrictions  Driving    Stability/Clinical Decision Making  Stable/Uncomplicated    Clinical Decision Making  Low    Rehab Potential  Good    PT  Frequency  2x / week    PT Duration  6 weeks    PT Treatment/Interventions  ADLs/Self Care Home Management;Cryotherapy;Electrical Stimulation;Moist Heat;Traction;Ultrasound;Neuromuscular re-education;Therapeutic exercise;Therapeutic activities;Functional mobility training;Stair training;Gait training;Patient/family education;Balance training;Manual techniques;Dry needling;Passive range of motion;Taping    PT Next Visit Plan  bike or nustep, core stabilization, left sciatic nerve flossing, modaliites PRN for pain relief.    PT Home Exercise Plan  see patient education section    Consulted and Agree with Plan of Care  Patient       Patient will benefit from skilled therapeutic intervention in order to improve the following deficits and impairments:  Decreased activity tolerance, Decreased strength, Decreased range of motion, Pain, Postural dysfunction  Visit Diagnosis: Chronic bilateral low back pain with left-sided sciatica  Muscle weakness (generalized)     Problem List Patient Active Problem List   Diagnosis Date Noted  . Lumbar strain, initial encounter 12/08/2018  . BMI 40.0-44.9, adult [facility]) 05/03/2015    Gabriela Eves, PT, DPT 08/11/2019, 6:02 PM  [facility] [address] [place], [place], [address] Phone: [phone]   Fax:  [phone]  Name: Sergio Wright MRN: [id number] Date of Birth: 1990-05-08

## 2019-08-13 ENCOUNTER — Encounter: Payer: 59 | Admitting: Physical Therapy

## 2019-08-18 ENCOUNTER — Ambulatory Visit: Payer: 59 | Attending: Family Medicine | Admitting: Physical Therapy

## 2019-08-18 ENCOUNTER — Other Ambulatory Visit: Payer: Self-pay

## 2019-08-18 ENCOUNTER — Encounter: Payer: Self-pay | Admitting: Physical Therapy

## 2019-08-18 DIAGNOSIS — M5442 Lumbago with sciatica, left side: Secondary | ICD-10-CM | POA: Diagnosis not present

## 2019-08-18 DIAGNOSIS — G8929 Other chronic pain: Secondary | ICD-10-CM | POA: Diagnosis present

## 2019-08-18 DIAGNOSIS — M6281 Muscle weakness (generalized): Secondary | ICD-10-CM

## 2019-08-18 NOTE — Therapy (Signed)
[facility] [place], [place], [address] Phone: [phone]   Fax:  [phone]  Physical Therapy Treatment  Patient Details  Name: Sergio Wright MRN: [id number] Date of Birth: 1990-10-06 Referring Provider (PT): Caryl Pina, MD   Encounter Date: 08/18/2019  PT End of Session - 08/18/19 1651    Visit Number  4    Number of Visits  12    Date for PT Re-Evaluation  09/18/19    Authorization Type  Progress note every 10th visit    PT Start Time  1645    PT Stop Time  1731    PT Time Calculation (min)  46 min    Activity Tolerance  Patient tolerated treatment well    Behavior During Therapy  [facility] for tasks assessed/performed       History reviewed. No pertinent past medical history.  History reviewed. No pertinent surgical history.  There were no vitals filed for this visit.  Subjective Assessment - 08/18/19 1647    Subjective  COVID-19 screening performed upon arrival. Patient feeling good today. Reports going to get an ultrasound but hasn't gotten it scheduled yet.    Pertinent History  Inguinal hernia    Limitations  Standing;Walking;House hold activities;Sitting    Diagnostic tests  n/a    Patient Stated Goals  decrease pain    Currently in Pain?  Yes   did no provide number on pain scale        [facility] PT Assessment - 08/18/19 0001      Assessment   Medical Diagnosis  lumbar strain, left sciatic nerve pain    Referring Provider (PT)  Caryl Pina, MD    Next MD Visit  none    Prior Therapy  no      Precautions   Precautions  None                    [facility] Adult PT Treatment/Exercise - 08/18/19 0001      Exercises   Exercises  Lumbar      Lumbar Exercises: Stretches   Lower Trunk Rotation  3 reps;30 seconds    Figure 4 Stretch  --    Other Lumbar Stretch Exercise  3 way prayer stretch 3x30" each      Lumbar Exercises: Aerobic   Nustep  level 6 x10 mins      Lumbar Exercises:  Machines for Strengthening   Cybex Lumbar Extension  60# 2x10; lumbar flexion 60# 2x10      Lumbar Exercises: Standing   Wall Slides  20 reps;2 seconds      Lumbar Exercises: Supine   Straight Leg Raise  10 reps;3 seconds    Other Supine Lumbar Exercises  sciatic nerve glides with 10 ankle pumps x3      Lumbar Exercises: Sidelying   Hip Abduction  Both;10 reps;2 seconds      Lumbar Exercises: Prone   Straight Leg Raise  10 reps;2 seconds   bilateral                 PT Long Term Goals - 07/31/19 1835      PT LONG TERM GOAL #1   Title  Patient will be independent with HEP    Time  6    Period  Weeks    Status  New      PT LONG TERM GOAL #2   Title  Patient will report a centralization of neurological symptoms to left glute  or report no neurological symptoms to indicate no nerve irritation    Time  6    Period  Weeks    Status  New      PT LONG TERM GOAL #3   Title  Patient will demonstrate 4+/5 or greater left LE MMT to improve stability during functional tasks.    Time  6    Period  Weeks    Status  New      PT LONG TERM GOAL #4   Title  Patient will report ability to perform ADLs and work activities with low back pain less than or equal to 2/10.    Time  6    Period  Weeks    Status  New            Plan - 08/18/19 1750    Clinical Impression Statement  Patient responded well to therapy session with progression of TEs. Patient still unable to perform bridge without neurological symptoms and pain therefore exercise terminated. Patient required intermittent cues for form and technique and able to maintain form after cuing.    Personal Factors and Comorbidities  Comorbidity 1;Time since onset of injury/illness/exacerbation    Comorbidities  inguinal hernia    Examination-Activity Limitations  Bathing;Sit;Stand;Transfers    Examination-Participation Restrictions  Driving    Stability/Clinical Decision Making  Stable/Uncomplicated    Clinical Decision  Making  Low    Rehab Potential  Good    PT Frequency  2x / week    PT Duration  6 weeks    PT Treatment/Interventions  ADLs/Self Care Home Management;Cryotherapy;Electrical Stimulation;Moist Heat;Traction;Ultrasound;Neuromuscular re-education;Therapeutic exercise;Therapeutic activities;Functional mobility training;Stair training;Gait training;Patient/family education;Balance training;Manual techniques;Dry needling;Passive range of motion;Taping    PT Next Visit Plan  bike or nustep, core stabilization, left sciatic nerve flossing, modaliites PRN for pain relief.    PT Home Exercise Plan  see patient education section    Consulted and Agree with Plan of Care  Patient       Patient will benefit from skilled therapeutic intervention in order to improve the following deficits and impairments:  Decreased activity tolerance, Decreased strength, Decreased range of motion, Pain, Postural dysfunction  Visit Diagnosis: Chronic bilateral low back pain with left-sided sciatica  Muscle weakness (generalized)     Problem List Patient Active Problem List   Diagnosis Date Noted  . Lumbar strain, initial encounter 12/08/2018  . BMI 40.0-44.9, adult [facility]) 05/03/2015    Sergio Wright 08/18/2019, 5:59 PM  [facility] [place], [place], [address] Phone: [phone]   Fax:  [phone]  Name: Sergio Wright MRN: [id number] Date of Birth: November 30, 1990

## 2019-08-20 ENCOUNTER — Ambulatory Visit: Payer: 59 | Admitting: Physical Therapy

## 2019-08-20 ENCOUNTER — Other Ambulatory Visit: Payer: Self-pay

## 2019-08-20 ENCOUNTER — Encounter: Payer: Self-pay | Admitting: Physical Therapy

## 2019-08-20 DIAGNOSIS — M6281 Muscle weakness (generalized): Secondary | ICD-10-CM

## 2019-08-20 DIAGNOSIS — M5442 Lumbago with sciatica, left side: Secondary | ICD-10-CM | POA: Diagnosis not present

## 2019-08-20 NOTE — Therapy (Signed)
[facility] [place], [place], [address] Phone: [phone]   Fax:  [phone]  Physical Therapy Treatment  Patient Details  Name: Sergio Wright MRN: [id number] Date of Birth: 02-Sep-1990 Referring Provider (PT): Caryl Pina, MD   Encounter Date: 08/20/2019  PT End of Session - 08/20/19 1718    Visit Number  5    Number of Visits  12    Date for PT Re-Evaluation  09/18/19    Authorization Type  Progress note every 10th visit    PT Start Time  1641    PT Stop Time  1726    PT Time Calculation (min)  45 min    Activity Tolerance  Patient tolerated treatment well    Behavior During Therapy  [facility] for tasks assessed/performed       History reviewed. No pertinent past medical history.  History reviewed. No pertinent surgical history.  There were no vitals filed for this visit.  Subjective Assessment - 08/20/19 1717    Subjective  COVID-19 screening performed upon arrival. Patient arrives with no pain and felt good after last session.    Pertinent History  Inguinal hernia    Limitations  Standing;Walking;House hold activities;Sitting    Diagnostic tests  n/a    Patient Stated Goals  decrease pain    Currently in Pain?  No/denies         [facility] PT Assessment - 08/20/19 0001      Assessment   Medical Diagnosis  lumbar strain, left sciatic nerve pain    Referring Provider (PT)  Caryl Pina, MD    Next MD Visit  none    Prior Therapy  no      Precautions   Precautions  None                    [facility] Adult PT Treatment/Exercise - 08/20/19 0001      Exercises   Exercises  Lumbar      Lumbar Exercises: Stretches   Standing Side Bend  Left;Right;2 reps;30 seconds    Other Lumbar Stretch Exercise  open book stretch 5" hold x10 mins      Lumbar Exercises: Aerobic   Nustep  level 6-8 x12 mins      Lumbar Exercises: Machines for Strengthening   Cybex Lumbar Extension  70# 2x10; lumbar flexion  70# 2x10    Leg Press  4 plates 2x10 with draw in      Lumbar Exercises: Standing   Functional Squats  10 reps;2 seconds    Functional Squats Limitations  with PVC pipe for hip hinge    Lifting  From floor;From waist;20 reps    Forward Lunge  3 seconds;10 reps    Forward Lunge Limitations  with 4# medicine ball                  PT Long Term Goals - 07/31/19 1835      PT LONG TERM GOAL #1   Title  Patient will be independent with HEP    Time  6    Period  Weeks    Status  New      PT LONG TERM GOAL #2   Title  Patient will report a centralization of neurological symptoms to left glute or report no neurological symptoms to indicate no nerve irritation    Time  6    Period  Weeks    Status  New      PT  LONG TERM GOAL #3   Title  Patient will demonstrate 4+/5 or greater left LE MMT to improve stability during functional tasks.    Time  6    Period  Weeks    Status  New      PT LONG TERM GOAL #4   Title  Patient will report ability to perform ADLs and work activities with low back pain less than or equal to 2/10.    Time  6    Period  Weeks    Status  New            Plan - 08/20/19 1725    Clinical Impression Statement  Patient responded well to the progression of exercises. Patient guided through TEs and open book stretch to which he noted with stiffness in lower thoracic and upper lumbar spine but no increase of symptoms. Patient utilized PVC pipe for hip hinge to which he demonstrated good form.    Personal Factors and Comorbidities  Comorbidity 1;Time since onset of injury/illness/exacerbation    Comorbidities  inguinal hernia    Examination-Activity Limitations  Bathing;Sit;Stand;Transfers    Examination-Participation Restrictions  Driving    Stability/Clinical Decision Making  Stable/Uncomplicated    Clinical Decision Making  Low    Rehab Potential  Good    PT Frequency  2x / week    PT Duration  6 weeks    PT Treatment/Interventions  ADLs/Self Care  Home Management;Cryotherapy;Electrical Stimulation;Moist Heat;Traction;Ultrasound;Neuromuscular re-education;Therapeutic exercise;Therapeutic activities;Functional mobility training;Stair training;Gait training;Patient/family education;Balance training;Manual techniques;Dry needling;Passive range of motion;Taping    PT Next Visit Plan  Assess goals; continue with bike or nustep, core stabilization, left sciatic nerve flossing, modaliites PRN for pain relief    PT Home Exercise Plan  see patient education section    Consulted and Agree with Plan of Care  Patient       Patient will benefit from skilled therapeutic intervention in order to improve the following deficits and impairments:  Decreased activity tolerance, Decreased strength, Decreased range of motion, Pain, Postural dysfunction  Visit Diagnosis: Chronic bilateral low back pain with left-sided sciatica  Muscle weakness (generalized)     Problem List Patient Active Problem List   Diagnosis Date Noted  . Lumbar strain, initial encounter 12/08/2018  . BMI 40.0-44.9, adult [facility]) 05/03/2015    Gabriela Eves, PT, DPT 08/20/2019, 5:37 PM  [facility] [address] [place], [place], [address] Phone: [phone]   Fax:  [phone]  Name: Sergio Wright MRN: [id number] Date of Birth: 1991/02/10

## 2019-08-21 ENCOUNTER — Other Ambulatory Visit: Payer: Self-pay | Admitting: General Surgery

## 2019-08-21 DIAGNOSIS — K4090 Unilateral inguinal hernia, without obstruction or gangrene, not specified as recurrent: Secondary | ICD-10-CM

## 2019-08-24 ENCOUNTER — Encounter: Payer: Self-pay | Admitting: Family Medicine

## 2019-08-24 ENCOUNTER — Telehealth (INDEPENDENT_AMBULATORY_CARE_PROVIDER_SITE_OTHER): Payer: 59 | Admitting: Family Medicine

## 2019-08-24 DIAGNOSIS — J309 Allergic rhinitis, unspecified: Secondary | ICD-10-CM

## 2019-08-24 MED ORDER — FLUTICASONE PROPIONATE 50 MCG/ACT NA SUSP: 1.0000 | Freq: Two times a day (BID) | NASAL | 6 refills | Status: DC | PRN

## 2019-08-24 MED ORDER — CETIRIZINE HCL 10 MG PO TABS: 10.0000 mg | ORAL_TABLET | Freq: Every day | ORAL | 0 refills | Status: DC

## 2019-08-24 NOTE — Progress Notes (Signed)
**Note Sergio-Identified via Obfuscation** Virtual Visit via Mychart Video Note  I connected with Sergio Wright on 08/24/19 at 1310 by video and verified that I am speaking with the correct person using two identifiers. Sergio Wright is currently located at home and no other people are currently with her during visit. The provider, Fransisca Kaufmann Teja Costen, MD is located in their office at time of visit.  Call ended at 1320  I discussed the limitations, risks, security and privacy concerns of performing an evaluation and management service by video and the availability of in person appointments. I also discussed with the patient that there may be a patient responsible charge related to this service. The patient expressed understanding and agreed to proceed.   History and Present Illness: Patient woke up yesterday with congestion and headache and pain behind my eyes.  He denies any fevers or chills. He denies any sick contacts. He denies shortness. He has a lot of drainage. He took some benadryl and it did not help yet.  He had a lot of congestion this AM but it is improving.   No diagnosis found.  Outpatient Encounter Medications as of 08/24/2019  Medication Sig   fluticasone (FLONASE) 50 MCG/ACT nasal spray Place 1 spray into both nostrils 2 (two) times daily as needed for allergies or rhinitis.   loratadine (CLARITIN) 10 MG tablet Take 10 mg by mouth daily as needed.    predniSONE (DELTASONE) 20 MG tablet 2 po at same time daily for 5 days   No facility-administered encounter medications on file as of 08/24/2019.    Review of Systems  Constitutional: Negative for chills and fever.  HENT: Positive for congestion, postnasal drip, rhinorrhea, sinus pressure and sneezing. Negative for ear discharge, ear pain, sore throat and voice change.   Eyes: Negative for pain, discharge, redness and visual disturbance.  Respiratory: Positive for cough. Negative for shortness of breath and wheezing.   Cardiovascular: Negative for chest pain and  leg swelling.  Musculoskeletal: Negative for gait problem.  Skin: Negative for rash.  All other systems reviewed and are negative.   Observations/Objective: Patient sounds and looks comfortable.   Assessment and Plan: Problem List Items Addressed This Visit    None    Visit Diagnoses    Allergic sinusitis       Relevant Medications   fluticasone (FLONASE) 50 MCG/ACT nasal spray   cetirizine (ZYRTEC) 10 MG tablet      Will try flonase and zyrtec and Mucinex and humidifier and call us back if worsening or not improved over the next week..  Follow up plan: Return if symptoms worsen or fail to improve.     I discussed the assessment and treatment plan with the patient. The patient was provided an opportunity to ask questions and all were answered. The patient agreed with the plan and demonstrated an understanding of the instructions.   The patient was advised to call back or seek an in-person evaluation if the symptoms worsen or if the condition fails to improve as anticipated.  The above assessment and management plan was discussed with the patient. The patient verbalized understanding of and has agreed to the management plan. Patient is aware to call the clinic if symptoms persist or worsen. Patient is aware when to return to the clinic for a follow-up visit. Patient educated on when it is appropriate to go to the emergency department.    I provided 10 minutes of non-face-to-face time during this encounter.    Fransisca Kaufmann Royale Swamy,  MD

## 2019-08-25 ENCOUNTER — Encounter: Payer: 59 | Admitting: Physical Therapy

## 2019-08-27 ENCOUNTER — Other Ambulatory Visit: Payer: Self-pay

## 2019-08-27 ENCOUNTER — Encounter: Payer: Self-pay | Admitting: Physical Therapy

## 2019-08-27 ENCOUNTER — Ambulatory Visit: Payer: 59 | Admitting: Physical Therapy

## 2019-08-27 DIAGNOSIS — M6281 Muscle weakness (generalized): Secondary | ICD-10-CM

## 2019-08-27 DIAGNOSIS — M5442 Lumbago with sciatica, left side: Secondary | ICD-10-CM | POA: Diagnosis not present

## 2019-08-27 DIAGNOSIS — G8929 Other chronic pain: Secondary | ICD-10-CM

## 2019-08-27 NOTE — Therapy (Signed)
[facility] [place], [place], [address] Phone: [phone]   Fax:  [phone]  Physical Therapy Treatment  Patient Details  Name: Sergio Wright MRN: [id number] Date of Birth: July 17, 1990 Referring Provider (PT): Caryl Pina, MD   Encounter Date: 08/27/2019   PT End of Session - 08/27/19 1700    Visit Number 6    Number of Visits 12    Date for PT Re-Evaluation 09/18/19    Authorization Type Progress note every 10th visit    PT Start Time 1645    PT Stop Time 1732    PT Time Calculation (min) 47 min    Activity Tolerance Patient tolerated treatment well    Behavior During Therapy [facility] for tasks assessed/performed           History reviewed. No pertinent past medical history.  History reviewed. No pertinent surgical history.  There were no vitals filed for this visit.   Subjective Assessment - 08/27/19 1659    Subjective COVID-19 screening performed upon arrival. Patient reports no pain right now but didn't do much as he was suffering from allergies earlier this week.    Pertinent History Inguinal hernia    Limitations Standing;Walking;House hold activities;Sitting    Diagnostic tests n/a    Patient Stated Goals decrease pain    Currently in Pain? No/denies              [facility] PT Assessment - 08/27/19 0001      Assessment   Medical Diagnosis lumbar strain, left sciatic nerve pain    Referring Provider (PT) Caryl Pina, MD    Next MD Visit none    Prior Therapy no      Precautions   Precautions None                         [facility] Adult PT Treatment/Exercise - 08/27/19 0001      Exercises   Exercises Lumbar      Lumbar Exercises: Stretches   Other Lumbar Stretch Exercise open book stretch 3" hold x15 reps each      Lumbar Exercises: Aerobic   Nustep level 8-10 x15 mins      Lumbar Exercises: Machines for Strengthening   Cybex Lumbar Extension 70# 3x10; lumbar flexion 70#  3x10    Leg Press 4 plates 3x10 with draw in      Lumbar Exercises: Standing   Forward Lunge 3 seconds;10 reps    Forward Lunge Limitations with 8# medicine ball      Lumbar Exercises: Seated   Long Arc Quad on Chair AROM;Both;20 reps   3" hold   Hip Flexion on Ball AROM;Both;10 reps   3" hold green physioball                      PT Long Term Goals - 08/27/19 1730      PT LONG TERM GOAL #1   Title Patient will be independent with HEP    Time 6    Period Weeks    Status Achieved      PT LONG TERM GOAL #2   Title Patient will report a centralization of neurological symptoms to left glute or report no neurological symptoms to indicate no nerve irritation    Time 6    Period Weeks    Status On-going   dependent on daily activities     PT LONG TERM GOAL #3   Title  Patient will demonstrate 4+/5 or greater left LE MMT to improve stability during functional tasks.    Time 6    Period Weeks    Status On-going      PT LONG TERM GOAL #4   Time 6    Period Weeks    Status On-going   depends on activities and the day                Plan - 08/27/19 1720    Clinical Impression Statement Patient responded very well to progression of exercises with no reports of increased pain. Patient demonstrated excellent form with new TEs as well as excellent core stabilization sitting on theraball. Patient attempted bird dog but unable to maintain proper alignment therefore exercise regressed to quaduped alterating leg lifts followed by alternating arm lifts.    Personal Factors and Comorbidities Comorbidity 1;Time since onset of injury/illness/exacerbation    Comorbidities inguinal hernia    Examination-Activity Limitations Bathing;Sit;Stand;Transfers    Examination-Participation Restrictions Driving    Stability/Clinical Decision Making Stable/Uncomplicated    Clinical Decision Making Low    Rehab Potential Good    PT Frequency 2x / week    PT Duration 6 weeks    PT  Treatment/Interventions ADLs/Self Care Home Management;Cryotherapy;Electrical Stimulation;Moist Heat;Traction;Ultrasound;Neuromuscular re-education;Therapeutic exercise;Therapeutic activities;Functional mobility training;Stair training;Gait training;Patient/family education;Balance training;Manual techniques;Dry needling;Passive range of motion;Taping    PT Next Visit Plan Assess goals; continue with bike or nustep, core stabilization, left sciatic nerve flossing, modaliites PRN for pain relief    PT Home Exercise Plan see patient education section    Consulted and Agree with Plan of Care Patient           Patient will benefit from skilled therapeutic intervention in order to improve the following deficits and impairments:  Decreased activity tolerance, Decreased strength, Decreased range of motion, Pain, Postural dysfunction  Visit Diagnosis: Chronic bilateral low back pain with left-sided sciatica  Muscle weakness (generalized)     Problem List Patient Active Problem List   Diagnosis Date Noted  . Lumbar strain, initial encounter 12/08/2018  . BMI 40.0-44.9, adult [facility]) 05/03/2015    Gabriela Eves, PT, DPT 08/27/2019, 5:42 PM  [facility] [address] [place], [place], [address] Phone: [phone]   Fax:  [phone]  Name: Sergio Wright MRN: [id number] Date of Birth: 1990/09/16

## 2019-09-01 ENCOUNTER — Encounter: Payer: Self-pay | Admitting: Physical Therapy

## 2019-09-01 ENCOUNTER — Other Ambulatory Visit: Payer: Self-pay

## 2019-09-01 ENCOUNTER — Ambulatory Visit: Payer: 59 | Admitting: Physical Therapy

## 2019-09-01 DIAGNOSIS — G8929 Other chronic pain: Secondary | ICD-10-CM

## 2019-09-01 DIAGNOSIS — M6281 Muscle weakness (generalized): Secondary | ICD-10-CM

## 2019-09-01 DIAGNOSIS — M5442 Lumbago with sciatica, left side: Secondary | ICD-10-CM | POA: Diagnosis not present

## 2019-09-01 NOTE — Therapy (Addendum)
[facility] [place], [place], [address] Phone: [phone]   Fax:  [phone]  Physical Therapy Treatment PHYSICAL THERAPY DISCHARGE SUMMARY  Visits from Start of Care: 7  Current functional level related to goals / functional outcomes: See below   Remaining deficits: See goals   Education / Equipment: HEP Plan: Patient agrees to discharge.  Patient goals were partially met. Patient is being discharged due to meeting the stated rehab goals.  ?????       Patient Details  Name: Sergio Wright MRN: [id number] Date of Birth: Feb 05, 1991 Referring Provider (PT): Caryl Pina, MD   Encounter Date: 09/01/2019   PT End of Session - 09/01/19 1649    Visit Number 7    Number of Visits 12    Date for PT Re-Evaluation 09/18/19    Authorization Type Progress note every 10th visit    PT Start Time 1647    PT Stop Time 1728    PT Time Calculation (min) 41 min    Activity Tolerance Patient tolerated treatment well    Behavior During Therapy [facility] for tasks assessed/performed           History reviewed. No pertinent past medical history.  History reviewed. No pertinent surgical history.  There were no vitals filed for this visit.   Subjective Assessment - 09/01/19 1647    Subjective COVID-19 screening performed upon arrival. Patient reports no pain currently but did have a twinge of pain earlier this morning lifting something that was not heavy at work.    Pertinent History Inguinal hernia    Limitations Standing;Walking;House hold activities;Sitting    Diagnostic tests n/a    Patient Stated Goals decrease pain    Currently in Pain? No/denies                             [facility] Adult PT Treatment/Exercise - 09/01/19 0001      Lumbar Exercises: Stretches   Lower Trunk Rotation 5 reps   5 sec holds   Other Lumbar Stretch Exercise open book stretch 3" hold x15 reps each      Lumbar Exercises:  Aerobic   Nustep L6 x10 min      Lumbar Exercises: Machines for Strengthening   Cybex Lumbar Extension 80# 2x10; lumbar flexion 80# 2x10    Leg Press 4 pl, seat 8 x20 reps      Lumbar Exercises: Standing   Functional Squats 20 reps    Functional Squats Limitations 6# medicine ball    Forward Lunge 15 reps;2 seconds    Forward Lunge Limitations 6# medicine ball    Scapular Retraction Strengthening;Both;20 reps;Limitations    Scapular Retraction Limitations Orange XTS on green ball    Other Standing Lumbar Exercises chop/lift orange XTS x10 reps    Other Standing Lumbar Exercises Core punches orange XTS x20 reps      Lumbar Exercises: Seated   Long Arc Quad on Chair AROM;Both;10 reps   green ball     Lumbar Exercises: Supine   Bridge with March 10 reps                       PT Long Term Goals - 08/27/19 1730      PT LONG TERM GOAL #1   Title Patient will be independent with HEP    Time 6    Period Weeks    Status Achieved  PT LONG TERM GOAL #2   Title Patient will report a centralization of neurological symptoms to left glute or report no neurological symptoms to indicate no nerve irritation    Time 6    Period Weeks    Status On-going   dependent on daily activities     PT LONG TERM GOAL #3   Title Patient will demonstrate 4+/5 or greater left LE MMT to improve stability during functional tasks.    Time 6    Period Weeks    Status On-going      PT LONG TERM GOAL #4   Time 6    Period Weeks    Status On-going   depends on activities and the day                Plan - 09/01/19 1733    Clinical Impression Statement Patient presented in clinic with only occasional reports of twinges of pain. Patient progressed in regards to core strengthening along with stabilization. Greatest challenge demonstrated with stabilization exercises on theraball. Patient also very challenged by bridge with marching. Patient remains compliant with HEP per patient  report. No pain reported at end of PT session.    Personal Factors and Comorbidities Comorbidity 1;Time since onset of injury/illness/exacerbation    Comorbidities inguinal hernia    Examination-Activity Limitations Bathing;Sit;Stand;Transfers    Examination-Participation Restrictions Driving    Stability/Clinical Decision Making Stable/Uncomplicated    Rehab Potential Good    PT Frequency 2x / week    PT Duration 6 weeks    PT Treatment/Interventions ADLs/Self Care Home Management;Cryotherapy;Electrical Stimulation;Moist Heat;Traction;Ultrasound;Neuromuscular re-education;Therapeutic exercise;Therapeutic activities;Functional mobility training;Stair training;Gait training;Patient/family education;Balance training;Manual techniques;Dry needling;Passive range of motion;Taping    PT Next Visit Plan Assess goals; continue with bike or nustep, core stabilization, left sciatic nerve flossing, modaliites PRN for pain relief    PT Home Exercise Plan see patient education section    Consulted and Agree with Plan of Care Patient           Patient will benefit from skilled therapeutic intervention in order to improve the following deficits and impairments:  Decreased activity tolerance, Decreased strength, Decreased range of motion, Pain, Postural dysfunction  Visit Diagnosis: Chronic bilateral low back pain with left-sided sciatica  Muscle weakness (generalized)     Problem List Patient Active Problem List   Diagnosis Date Noted  . Lumbar strain, initial encounter 12/08/2018  . BMI 40.0-44.9, adult [facility]) 05/03/2015    Standley Brooking, PTA 09/01/2019, 5:36 PM  [facility] [address] [place], [place], [address] Phone: [phone]   Fax:  [phone]  Name: Sergio Wright MRN: [id number] Date of Birth: 04-18-1990

## 2019-09-03 ENCOUNTER — Ambulatory Visit
Admission: RE | Admit: 2019-09-03 | Discharge: 2019-09-03 | Disposition: A | Discharge status: Home / Self Care | Payer: 59 | Source: Ambulatory Visit | Attending: General Surgery | Admitting: General Surgery

## 2019-09-03 DIAGNOSIS — K4090 Unilateral inguinal hernia, without obstruction or gangrene, not specified as recurrent: Secondary | ICD-10-CM

## 2019-09-08 ENCOUNTER — Ambulatory Visit: Payer: Self-pay | Admitting: General Surgery

## 2019-09-20 ENCOUNTER — Other Ambulatory Visit: Payer: Self-pay | Admitting: Family Medicine

## 2019-09-20 DIAGNOSIS — J309 Allergic rhinitis, unspecified: Secondary | ICD-10-CM

## 2019-10-06 ENCOUNTER — Encounter: Payer: Self-pay | Admitting: Nurse Practitioner

## 2019-10-06 ENCOUNTER — Ambulatory Visit (INDEPENDENT_AMBULATORY_CARE_PROVIDER_SITE_OTHER): Payer: 59 | Admitting: Nurse Practitioner

## 2019-10-06 DIAGNOSIS — S39012A Strain of muscle, fascia and tendon of lower back, initial encounter: Secondary | ICD-10-CM | POA: Diagnosis not present

## 2019-10-06 MED ORDER — TIZANIDINE HCL 2 MG PO CAPS: 2.0000 mg | ORAL_CAPSULE | Freq: Three times a day (TID) | ORAL | 1 refills | Status: DC

## 2019-10-06 NOTE — Progress Notes (Signed)
**Note Sergio-Identified via Obfuscation**    Virtual Visit via telephone Note Due to COVID-19 pandemic this visit was conducted virtually. This visit type was conducted due to national recommendations for restrictions regarding the COVID-19 Pandemic (e.g. social distancing, sheltering in place) in an effort to limit this patient's exposure and mitigate transmission in our community. All issues noted in this document were discussed and addressed.  A physical exam was not performed with this format.  I connected with Sergio Wright on 10/06/19 at 1:50 by telephone and verified that I am speaking with the correct person using two identifiers. Sergio Wright is currently located at home and no one is currently with  hom during visit. The provider, Mary-Margaret Hassell Done, FNP is located in their office at time of visit.  I discussed the limitations, risks, security and privacy concerns of performing an evaluation and management service by telephone and the availability of in person appointments. I also discussed with the patient that there may be a patient responsible charge related to this service. The patient expressed understanding and agreed to proceed.   History and Present Illness:    Chief Complaint: Inguinal Hernia   HPI Patient calls in c/o left inguinal hernia. He was doing some heavy Sunday and popped his inguinal hernia out. He is scheduled for surgery in 3 weeks. The hernia causes him to walk different which makes his back hurt. Rates back pain 7-8/10. He ha dto leave work today due to back pain. He usually takes tizanidine which helps his back. Just needs refilll  To take until he gets his hernia repaired.  Review of Systems  Musculoskeletal: Positive for back pain and myalgias.     Observations/Objective: Alert and oriented- answers all questions appropriately Mild  distress    Assessment and Plan: Sergio Wright in today with chief complaint of Inguinal Hernia   1. Lumbar strain, initial encounter Moist heat No  heavy lifying until can have hernia surgery RTO prn  - tizanidine (ZANAFLEX) 2 MG capsule; Take 1 capsule (2 mg total) by mouth 3 (three) times daily.  Dispense: 60 capsule; Refill: 1    Follow Up Instructions:    I discussed the assessment and treatment plan with the patient. The patient was provided an opportunity to ask questions and all were answered. The patient agreed with the plan and demonstrated an understanding of the instructions.   The patient was advised to call back or seek an in-person evaluation if the symptoms worsen or if the condition fails to improve as anticipated.  The above assessment and management plan was discussed with the patient. The patient verbalized understanding of and has agreed to the management plan. Patient is aware to call the clinic if symptoms persist or worsen. Patient is aware when to return to the clinic for a follow-up visit. Patient educated on when it is appropriate to go to the emergency department.   Time call ended:  2:05  I provided 15 minutes of non-face-to-face time during this encounter.    Mary-Margaret Hassell Done, FNP

## 2019-10-12 ENCOUNTER — Telehealth: Payer: Self-pay | Admitting: Family Medicine

## 2019-10-12 NOTE — Telephone Encounter (Signed)
Pt had a televisit 10/06/2019 with [facility] and says that rx given did not work tizanidine (ZANAFLEX) 2 MG capsule. He went to [facility] ER the next day and the dr there said tizanidine (ZANAFLEX) 2 MG capsule would not work. So he was prescribed flexeril and pt said it's not working either. He did get a steriod shot that helped for a day. Pt is having lower back and leg sciatica pain. Pt uses [facility] in [place]. Please call back.

## 2019-10-12 NOTE — Telephone Encounter (Signed)
He wil NTBS if no better

## 2019-10-12 NOTE — Telephone Encounter (Signed)
Any other recommendations???

## 2019-10-12 NOTE — Telephone Encounter (Signed)
Spoke with patients, no appointments are available with Chevis Pretty this week so patient was scheduled to see Hendricks Limes on Thursday 10/15/19 at 4:05 pm.

## 2019-10-15 ENCOUNTER — Ambulatory Visit: Payer: 59 | Admitting: Family Medicine

## 2019-10-15 ENCOUNTER — Other Ambulatory Visit: Payer: Self-pay

## 2019-10-23 ENCOUNTER — Other Ambulatory Visit (HOSPITAL_COMMUNITY): Payer: 59

## 2019-10-29 NOTE — Progress Notes (Signed)
[id number] - [place], [place] [address] [address] Phone: [phone] Fax: [phone]      Your procedure is scheduled on Tuesday 11/03/2019.  Report to [facility] Main Entrance "A" at 09:00 A.M., and check in at the Admitting office.  Call this number if you have problems the morning of surgery:  [phone]  Call [phone] if you have any questions prior to your surgery date Monday-Friday 8am-4pm    Remember:  Do not eat after midnight the night before your surgery  You may drink clear liquids until 08:00am the morning of your surgery.   Clear liquids allowed are: Water, Non-Citrus Juices (without pulp), Carbonated Beverages, Clear Tea, Black Coffee Only, and Gatorade    Take these medicines the morning of surgery with A SIP OF WATER: Cyclobenzaprine (Flexeril) - if needed  As of today, STOP taking any Aspirin (unless otherwise instructed by your surgeon) Aleve, Naproxen, Ibuprofen, Motrin, Advil, Goody's, BC's, all herbal medications, fish oil, and all vitamins.                      Do not wear jewelry, make up, or nail polish            Do not wear lotions, powders, colognes, or deodorant.            Do not shave 48 hours prior to surgery.  Men may shave face and neck.            Do not bring valuables to the hospital.            [facility] is not responsible for any belongings or valuables.  Do NOT Smoke (Tobacco/Vaping) or drink Alcohol 24 hours prior to your procedure  If you use a CPAP at night, you may bring all equipment for your overnight stay.   Contacts, glasses, dentures or bridgework may not be worn into surgery.      For patients admitted to the hospital, discharge time will be determined by your treatment team.   Patients discharged the day of surgery will not be allowed to drive home, and someone needs to stay with them for 24 hours.    Special instructions:   [facility]- Preparing For Surgery  Before  surgery, you can play an important role. Because skin is not sterile, your skin needs to be as free of germs as possible. You can reduce the number of germs on your skin by washing with CHG (chlorahexidine gluconate) Soap before surgery.  CHG is an antiseptic cleaner which kills germs and bonds with the skin to continue killing germs even after washing.    Oral Hygiene is also important to reduce your risk of infection.  Remember - BRUSH YOUR TEETH THE MORNING OF SURGERY WITH YOUR REGULAR TOOTHPASTE  Please do not use if you have an allergy to CHG or antibacterial soaps. If your skin becomes reddened/irritated stop using the CHG.  Do not shave (including legs and underarms) for at least 48 hours prior to first CHG shower. It is OK to shave your face.  Please follow these instructions carefully.   1. Shower the NIGHT BEFORE SURGERY and the MORNING OF SURGERY with CHG Soap.   2. If you chose to wash your hair, wash your hair first as usual with your normal shampoo.  3. After you shampoo, rinse your hair and body thoroughly to remove the shampoo.  4. Use CHG as you  would any other liquid soap. You can apply CHG directly to the skin and wash gently with a scrungie or a clean washcloth.   5. Apply the CHG Soap to your body ONLY FROM THE NECK DOWN.  Do not use on open wounds or open sores. Avoid contact with your eyes, ears, mouth and genitals (private parts). Wash Face and genitals (private parts)  with your normal soap.   6. Wash thoroughly, paying special attention to the area where your surgery will be performed.  7. Thoroughly rinse your body with warm water from the neck down.  8. DO NOT shower/wash with your normal soap after using and rinsing off the CHG Soap.  9. Pat yourself dry with a CLEAN TOWEL.  10. Wear CLEAN PAJAMAS to bed the night before surgery  11. Place CLEAN SHEETS on your bed the night of your first shower and DO NOT SLEEP WITH PETS.   Day of Surgery: Shower with CHG  soap as directed Wear Clean/Comfortable clothing the morning of surgery Do not apply any deodorants/lotions.   Remember to brush your teeth WITH YOUR REGULAR TOOTHPASTE.   Please read over the following fact sheets that you were given.

## 2019-10-30 ENCOUNTER — Encounter (HOSPITAL_COMMUNITY): Payer: Self-pay

## 2019-10-30 ENCOUNTER — Encounter (HOSPITAL_COMMUNITY)
Admission: RE | Admit: 2019-10-30 | Discharge: 2019-10-30 | Disposition: A | Discharge status: Home / Self Care | Payer: 59 | Source: Ambulatory Visit | Attending: General Surgery | Admitting: General Surgery

## 2019-10-30 ENCOUNTER — Other Ambulatory Visit: Payer: Self-pay

## 2019-10-30 ENCOUNTER — Other Ambulatory Visit (HOSPITAL_COMMUNITY)
Admission: RE | Admit: 2019-10-30 | Discharge: 2019-10-30 | Disposition: A | Discharge status: Home / Self Care | Payer: 59 | Source: Ambulatory Visit | Attending: General Surgery | Admitting: General Surgery

## 2019-10-30 DIAGNOSIS — Z01812 Encounter for preprocedural laboratory examination: Secondary | ICD-10-CM | POA: Insufficient documentation

## 2019-10-30 DIAGNOSIS — Z20822 Contact with and (suspected) exposure to covid-19: Secondary | ICD-10-CM | POA: Insufficient documentation

## 2019-10-30 LAB — CBC
HCT: 47.2 % (ref 39.0–52.0)
Hemoglobin: 15.8 g/dL (ref 13.0–17.0)
MCH: 30.5 pg (ref 26.0–34.0)
MCHC: 33.5 g/dL (ref 30.0–36.0)
MCV: 91.1 fL (ref 80.0–100.0)
Platelets: 230 10*3/uL (ref 150–400)
RBC: 5.18 MIL/uL (ref 4.22–5.81)
RDW: 12.1 % (ref 11.5–15.5)
WBC: 8.3 10*3/uL (ref 4.0–10.5)
nRBC: 0 % (ref 0.0–0.2)

## 2019-10-30 LAB — SARS CORONAVIRUS 2 (TAT 6-24 HRS): SARS Coronavirus 2: NEGATIVE

## 2019-10-30 NOTE — Progress Notes (Signed)
PCP - Vonna Kotyk Dettinger MD Cardiologist - pt denies  Chest x-ray - n/a EKG - n/a Stress Test - pt denies ECHO - pt denies Cardiac Cath - pt denies    ERAS Protcol - yes PRE-SURGERY Ensure or G2-  ENSURE  COVID TEST- 10/30/19  Coronavirus Screening  Have you experienced the following symptoms:  Cough yes/no: No Fever (>100.90F)  yes/no: No Runny nose yes/no: No Sore throat yes/no: No Difficulty breathing/shortness of breath  yes/no: No  Have you or a family member traveled in the last 14 days and where? yes/no: No   If the patient indicates "YES" to the above questions, their PAT will be rescheduled to limit the exposure to others and, the surgeon will be notified. THE PATIENT WILL NEED TO BE ASYMPTOMATIC FOR 14 DAYS.   If the patient is not experiencing any of these symptoms, the PAT nurse will instruct them to NOT bring anyone with them to their appointment since they may have these symptoms or traveled as well.   Please remind your patients and families that hospital visitation restrictions are in effect and the importance of the restrictions.     Anesthesia review: n/a  Patient denies shortness of breath, fever, cough and chest pain at PAT appointment   All instructions explained to the patient, with a verbal understanding of the material. Patient agrees to go over the instructions while at home for a better understanding. Patient also instructed to self quarantine after being tested for COVID-19. The opportunity to ask questions was provided.

## 2019-10-30 NOTE — Progress Notes (Signed)
Your procedure is scheduled on Tuesday August 17.  Report to [facility] Main Entrance "A" at 09:00 A.M., and check in at the Admitting office.  Call this number if you have problems the morning of surgery:  [phone]  Call [phone] if you have any questions prior to your surgery date Monday-Friday 8am-4pm    Remember:  Do not eat after midnight the night before your surgery  You may drink clear liquids until 08:00am the morning of your surgery.   Clear liquids allowed are: Water, Non-Citrus Juices (without pulp), Carbonated Beverages, Clear Tea, Black Coffee Only, and Gatorade  Please complete your PRE-SURGERY ENSURE that was provided to you by 08:00am the morning of your surgery.   Please, if able, drink it in one setting. DO NOT SIP.     Take these medicines the morning of surgery with A SIP OF WATER: Cyclobenzaprine (Flexeril) - if needed  As of today, STOP taking any Aspirin (unless otherwise instructed by your surgeon) Aleve, Naproxen, Ibuprofen, Motrin, Advil, Goody's, BC's, all herbal medications, fish oil, and all vitamins.                      Do not wear jewelry            Do not wear lotions, powders, colognes, or deodorant.            Men may shave face and neck.            Do not bring valuables to the hospital.            [facility] is not responsible for any belongings or valuables.  Do NOT Smoke (Tobacco/Vaping) or drink Alcohol 24 hours prior to your procedure  If you use a CPAP at night, you may bring all equipment for your overnight stay.   Contacts, glasses, dentures or bridgework may not be worn into surgery.      For patients admitted to the hospital, discharge time will be determined by your treatment team.   Patients discharged the day of surgery will not be allowed to drive home, and someone needs to stay with them for 24 hours.    Special instructions:   - Preparing For Surgery  Before surgery, you can play an important  role. Because skin is not sterile, your skin needs to be as free of germs as possible. You can reduce the number of germs on your skin by washing with CHG (chlorahexidine gluconate) Soap before surgery.  CHG is an antiseptic cleaner which kills germs and bonds with the skin to continue killing germs even after washing.    Oral Hygiene is also important to reduce your risk of infection.  Remember - BRUSH YOUR TEETH THE MORNING OF SURGERY WITH YOUR REGULAR TOOTHPASTE  Please do not use if you have an allergy to CHG or antibacterial soaps. If your skin becomes reddened/irritated stop using the CHG.  Do not shave (including legs and underarms) for at least 48 hours prior to first CHG shower. It is OK to shave your face.  Please follow these instructions carefully.   1. Shower the NIGHT BEFORE SURGERY and the MORNING OF SURGERY with CHG Soap.   2. If you chose to wash your hair, wash your hair first as usual with your normal shampoo.  3. After you shampoo, rinse your hair and body thoroughly to remove the shampoo.  4. Use CHG as you would any other liquid soap. You can  apply CHG directly to the skin and wash gently with a scrungie or a clean washcloth.   5. Apply the CHG Soap to your body ONLY FROM THE NECK DOWN.  Do not use on open wounds or open sores. Avoid contact with your eyes, ears, mouth and genitals (private parts). Wash Face and genitals (private parts)  with your normal soap.   6. Wash thoroughly, paying special attention to the area where your surgery will be performed.  7. Thoroughly rinse your body with warm water from the neck down.  8. DO NOT shower/wash with your normal soap after using and rinsing off the CHG Soap.  9. Pat yourself dry with a CLEAN TOWEL.  10. Wear CLEAN PAJAMAS to bed the night before surgery  11. Place CLEAN SHEETS on your bed the night of your first shower and DO NOT SLEEP WITH PETS.   Day of Surgery: Shower with CHG soap as directed Wear  Clean/Comfortable clothing the morning of surgery Do not apply any deodorants/lotions.   Remember to brush your teeth WITH YOUR REGULAR TOOTHPASTE.   Please read over the following fact sheets that you were given.

## 2019-11-02 MED ORDER — DEXTROSE 5 % IV SOLN
3.0000 g | INTRAVENOUS | Status: AC
  Administered 2019-11-03: 3 g via INTRAVENOUS
  Filled 2019-11-02: qty 3

## 2019-11-03 ENCOUNTER — Other Ambulatory Visit: Payer: Self-pay

## 2019-11-03 ENCOUNTER — Encounter (HOSPITAL_COMMUNITY): Payer: Self-pay | Admitting: General Surgery

## 2019-11-03 ENCOUNTER — Ambulatory Visit (HOSPITAL_COMMUNITY): Payer: 59 | Admitting: Certified Registered Nurse Anesthetist

## 2019-11-03 ENCOUNTER — Ambulatory Visit (HOSPITAL_COMMUNITY): Payer: 59 | Admitting: Vascular Surgery

## 2019-11-03 ENCOUNTER — Ambulatory Visit (HOSPITAL_COMMUNITY)
Admission: RE | Admit: 2019-11-03 | Discharge: 2019-11-03 | Disposition: A | Discharge status: Home / Self Care | Payer: 59 | Source: Ambulatory Visit | Attending: General Surgery | Admitting: General Surgery

## 2019-11-03 ENCOUNTER — Encounter (HOSPITAL_COMMUNITY)
Admission: RE | Disposition: A | Discharge status: Home / Self Care | Payer: Self-pay | Source: Ambulatory Visit | Attending: General Surgery

## 2019-11-03 DIAGNOSIS — F17200 Nicotine dependence, unspecified, uncomplicated: Secondary | ICD-10-CM | POA: Diagnosis not present

## 2019-11-03 DIAGNOSIS — D176 Benign lipomatous neoplasm of spermatic cord: Secondary | ICD-10-CM | POA: Insufficient documentation

## 2019-11-03 DIAGNOSIS — K4090 Unilateral inguinal hernia, without obstruction or gangrene, not specified as recurrent: Secondary | ICD-10-CM | POA: Insufficient documentation

## 2019-11-03 HISTORY — PX: XI ROBOTIC ASSISTED INGUINAL HERNIA REPAIR WITH MESH: SHX6706

## 2019-11-03 SURGERY — REPAIR, HERNIA, INGUINAL, ROBOT-ASSISTED, LAPAROSCOPIC, USING MESH
Anesthesia: General | Site: Groin | Laterality: Left

## 2019-11-03 MED ORDER — ACETAMINOPHEN 160 MG/5ML PO SOLN: 325.0000 mg | Freq: Once | ORAL | Status: DC | PRN

## 2019-11-03 MED ORDER — CHLORHEXIDINE GLUCONATE CLOTH 2 % EX PADS: 6.0000 | MEDICATED_PAD | Freq: Once | CUTANEOUS | Status: DC

## 2019-11-03 MED ORDER — FENTANYL CITRATE (PF) 250 MCG/5ML IJ SOLN
INTRAMUSCULAR | Status: DC | PRN
  Administered 2019-11-03: 50 ug via INTRAVENOUS
  Administered 2019-11-03 (×2): 100 ug via INTRAVENOUS

## 2019-11-03 MED ORDER — BUPIVACAINE HCL (PF) 0.25 % IJ SOLN
INTRAMUSCULAR | Status: DC | PRN
  Administered 2019-11-03: 8 mL

## 2019-11-03 MED ORDER — TRAMADOL HCL 50 MG PO TABS
ORAL_TABLET | ORAL | Status: AC
  Filled 2019-11-03: qty 1

## 2019-11-03 MED ORDER — ONDANSETRON HCL 4 MG/2ML IJ SOLN
INTRAMUSCULAR | Status: AC
  Filled 2019-11-03: qty 2

## 2019-11-03 MED ORDER — LACTATED RINGERS IV SOLN: INTRAVENOUS | Status: DC

## 2019-11-03 MED ORDER — SUCCINYLCHOLINE CHLORIDE 200 MG/10ML IV SOSY
PREFILLED_SYRINGE | INTRAVENOUS | Status: AC
  Filled 2019-11-03: qty 10

## 2019-11-03 MED ORDER — ROCURONIUM BROMIDE 10 MG/ML (PF) SYRINGE
PREFILLED_SYRINGE | INTRAVENOUS | Status: DC | PRN
  Administered 2019-11-03: 70 mg via INTRAVENOUS
  Administered 2019-11-03 (×2): 10 mg via INTRAVENOUS

## 2019-11-03 MED ORDER — MEPERIDINE HCL 25 MG/ML IJ SOLN: 6.2500 mg | INTRAMUSCULAR | Status: DC | PRN

## 2019-11-03 MED ORDER — 0.9 % SODIUM CHLORIDE (POUR BTL) OPTIME
TOPICAL | Status: DC | PRN
  Administered 2019-11-03: 1000 mL

## 2019-11-03 MED ORDER — PHENYLEPHRINE 40 MCG/ML (10ML) SYRINGE FOR IV PUSH (FOR BLOOD PRESSURE SUPPORT)
PREFILLED_SYRINGE | INTRAVENOUS | Status: AC
  Filled 2019-11-03: qty 10

## 2019-11-03 MED ORDER — ACETAMINOPHEN 500 MG PO TABS
1000.0000 mg | ORAL_TABLET | ORAL | Status: AC
  Administered 2019-11-03: 1000 mg via ORAL
  Filled 2019-11-03: qty 2

## 2019-11-03 MED ORDER — TRAMADOL HCL 50 MG PO TABS
50.0000 mg | ORAL_TABLET | Freq: Once | ORAL | Status: AC
  Administered 2019-11-03: 50 mg via ORAL

## 2019-11-03 MED ORDER — STERILE WATER FOR IRRIGATION IR SOLN
Status: DC | PRN
  Administered 2019-11-03: 1000 mL

## 2019-11-03 MED ORDER — PROPOFOL 10 MG/ML IV BOLUS
INTRAVENOUS | Status: AC
  Filled 2019-11-03: qty 40

## 2019-11-03 MED ORDER — LIDOCAINE 2% (20 MG/ML) 5 ML SYRINGE
INTRAMUSCULAR | Status: DC | PRN
  Administered 2019-11-03: 60 mg via INTRAVENOUS

## 2019-11-03 MED ORDER — ACETAMINOPHEN 10 MG/ML IV SOLN
1000.0000 mg | Freq: Once | INTRAVENOUS | Status: DC | PRN
  Administered 2019-11-03: 1000 mg via INTRAVENOUS

## 2019-11-03 MED ORDER — FENTANYL CITRATE (PF) 100 MCG/2ML IJ SOLN
50.0000 ug | INTRAMUSCULAR | Status: AC | PRN
  Administered 2019-11-03 (×3): 50 ug via INTRAVENOUS

## 2019-11-03 MED ORDER — HYDROMORPHONE HCL 1 MG/ML IJ SOLN: 0.2500 mg | INTRAMUSCULAR | Status: DC | PRN

## 2019-11-03 MED ORDER — BUPIVACAINE LIPOSOME 1.3 % IJ SUSP
INTRAMUSCULAR | Status: DC | PRN
  Administered 2019-11-03: 20 mL

## 2019-11-03 MED ORDER — ACETAMINOPHEN 10 MG/ML IV SOLN
INTRAVENOUS | Status: AC
  Filled 2019-11-03: qty 100

## 2019-11-03 MED ORDER — PROPOFOL 10 MG/ML IV BOLUS
INTRAVENOUS | Status: DC | PRN
  Administered 2019-11-03: 300 mg via INTRAVENOUS
  Administered 2019-11-03: 20 mg via INTRAVENOUS

## 2019-11-03 MED ORDER — ROCURONIUM BROMIDE 10 MG/ML (PF) SYRINGE
PREFILLED_SYRINGE | INTRAVENOUS | Status: AC
  Filled 2019-11-03: qty 10

## 2019-11-03 MED ORDER — AMISULPRIDE (ANTIEMETIC) 5 MG/2ML IV SOLN: 10.0000 mg | Freq: Once | INTRAVENOUS | Status: DC | PRN

## 2019-11-03 MED ORDER — FENTANYL CITRATE (PF) 250 MCG/5ML IJ SOLN
INTRAMUSCULAR | Status: AC
  Filled 2019-11-03: qty 5

## 2019-11-03 MED ORDER — TRAMADOL HCL 50 MG PO TABS: 50.0000 mg | ORAL_TABLET | Freq: Four times a day (QID) | ORAL | 0 refills | Status: DC | PRN

## 2019-11-03 MED ORDER — DEXAMETHASONE SODIUM PHOSPHATE 10 MG/ML IJ SOLN
INTRAMUSCULAR | Status: AC
  Filled 2019-11-03: qty 2

## 2019-11-03 MED ORDER — SUCCINYLCHOLINE CHLORIDE 200 MG/10ML IV SOSY
PREFILLED_SYRINGE | INTRAVENOUS | Status: DC | PRN
  Administered 2019-11-03: 160 mg via INTRAVENOUS

## 2019-11-03 MED ORDER — CHLORHEXIDINE GLUCONATE 0.12 % MT SOLN
15.0000 mL | Freq: Once | OROMUCOSAL | Status: AC
  Administered 2019-11-03: 15 mL via OROMUCOSAL
  Filled 2019-11-03: qty 15

## 2019-11-03 MED ORDER — NORMAL SALINE FLUSH 0.9 % IV SOLN
INTRAVENOUS | Status: DC | PRN
  Administered 2019-11-03 (×2): 10 mL

## 2019-11-03 MED ORDER — DEXAMETHASONE SODIUM PHOSPHATE 10 MG/ML IJ SOLN
INTRAMUSCULAR | Status: DC | PRN
  Administered 2019-11-03: 10 mg via INTRAVENOUS

## 2019-11-03 MED ORDER — BUPIVACAINE HCL (PF) 0.25 % IJ SOLN
INTRAMUSCULAR | Status: AC
  Filled 2019-11-03: qty 30

## 2019-11-03 MED ORDER — BUPIVACAINE LIPOSOME 1.3 % IJ SUSP
20.0000 mL | Freq: Once | INTRAMUSCULAR | Status: DC
  Filled 2019-11-03: qty 20

## 2019-11-03 MED ORDER — ENSURE PRE-SURGERY PO LIQD: 296.0000 mL | Freq: Once | ORAL | Status: DC

## 2019-11-03 MED ORDER — SUGAMMADEX SODIUM 200 MG/2ML IV SOLN
INTRAVENOUS | Status: DC | PRN
  Administered 2019-11-03: 400 mg via INTRAVENOUS

## 2019-11-03 MED ORDER — MIDAZOLAM HCL 2 MG/2ML IJ SOLN
INTRAMUSCULAR | Status: AC
  Filled 2019-11-03: qty 2

## 2019-11-03 MED ORDER — LIDOCAINE 2% (20 MG/ML) 5 ML SYRINGE
INTRAMUSCULAR | Status: AC
  Filled 2019-11-03: qty 5

## 2019-11-03 MED ORDER — FENTANYL CITRATE (PF) 100 MCG/2ML IJ SOLN
INTRAMUSCULAR | Status: AC
  Filled 2019-11-03: qty 2

## 2019-11-03 MED ORDER — MIDAZOLAM HCL 5 MG/5ML IJ SOLN
INTRAMUSCULAR | Status: DC | PRN
  Administered 2019-11-03: 2 mg via INTRAVENOUS

## 2019-11-03 MED ORDER — ACETAMINOPHEN 325 MG PO TABS: 325.0000 mg | ORAL_TABLET | Freq: Once | ORAL | Status: DC | PRN

## 2019-11-03 MED ORDER — EPHEDRINE 5 MG/ML INJ
INTRAVENOUS | Status: AC
  Filled 2019-11-03: qty 10

## 2019-11-03 MED ORDER — ORAL CARE MOUTH RINSE: 15.0000 mL | Freq: Once | OROMUCOSAL | Status: AC

## 2019-11-03 MED ORDER — ONDANSETRON HCL 4 MG/2ML IJ SOLN
INTRAMUSCULAR | Status: DC | PRN
  Administered 2019-11-03: 4 mg via INTRAVENOUS

## 2019-11-03 MED FILL — Sodium Chloride Flush IV Soln 0.9%: INTRAVENOUS | Qty: 10 | Status: AC

## 2019-11-03 SURGICAL SUPPLY — 64 items
ADH SKN CLS APL DERMABOND .7 (GAUZE/BANDAGES/DRESSINGS) ×1
APL PRP STRL LF DISP 70% ISPRP (MISCELLANEOUS) ×1
CANNULA REDUC XI 12-8 STAPL (CANNULA) ×2
CANNULA REDUC XI 12-8MM STAPL (CANNULA) ×1
CANNULA REDUCER 12-8 DVNC XI (CANNULA) ×1 IMPLANT
CHLORAPREP W/TINT 26 (MISCELLANEOUS) ×3 IMPLANT
COVER SURGICAL LIGHT HANDLE (MISCELLANEOUS) ×3 IMPLANT
COVER TIP SHEARS 8 DVNC (MISCELLANEOUS) ×1 IMPLANT
COVER TIP SHEARS 8MM DA VINCI (MISCELLANEOUS) ×3
COVER WAND RF STERILE (DRAPES) IMPLANT
DECANTER SPIKE VIAL GLASS SM (MISCELLANEOUS) ×3 IMPLANT
DEFOGGER SCOPE WARMER CLEARIFY (MISCELLANEOUS) ×1 IMPLANT
DERMABOND ADVANCED (GAUZE/BANDAGES/DRESSINGS) ×2
DERMABOND ADVANCED .7 DNX12 (GAUZE/BANDAGES/DRESSINGS) ×1 IMPLANT
DEVICE TROCAR PUNCTURE CLOSURE (ENDOMECHANICALS) ×3 IMPLANT
DRAPE ARM DVNC X/XI (DISPOSABLE) ×4 IMPLANT
DRAPE COLUMN DVNC XI (DISPOSABLE) ×1 IMPLANT
DRAPE CV SPLIT W-CLR ANES SCRN (DRAPES) ×3 IMPLANT
DRAPE DA VINCI XI ARM (DISPOSABLE) ×12
DRAPE DA VINCI XI COLUMN (DISPOSABLE) ×3
DRAPE ORTHO SPLIT 77X108 STRL (DRAPES) ×3
DRAPE SURG ORHT 6 SPLT 77X108 (DRAPES) ×1 IMPLANT
ELECT REM PT RETURN 9FT ADLT (ELECTROSURGICAL) ×3
ELECTRODE REM PT RTRN 9FT ADLT (ELECTROSURGICAL) ×1 IMPLANT
GLOVE BIO SURGEON STRL SZ7.5 (GLOVE) ×6 IMPLANT
GOWN STRL REUS W/ TWL LRG LVL3 (GOWN DISPOSABLE) ×2 IMPLANT
GOWN STRL REUS W/ TWL XL LVL3 (GOWN DISPOSABLE) ×2 IMPLANT
GOWN STRL REUS W/TWL 2XL LVL3 (GOWN DISPOSABLE) ×3 IMPLANT
GOWN STRL REUS W/TWL LRG LVL3 (GOWN DISPOSABLE) ×6
GOWN STRL REUS W/TWL XL LVL3 (GOWN DISPOSABLE) ×6
KIT BASIN OR (CUSTOM PROCEDURE TRAY) ×3 IMPLANT
KIT TURNOVER KIT B (KITS) ×3 IMPLANT
MARKER SKIN DUAL TIP RULER LAB (MISCELLANEOUS) ×3 IMPLANT
MESH PROGRIP LAP SELF FIXATING ×3 IMPLANT
MESH PROGRIP LAP SLF FIX 16X12 IMPLANT
NDL INSUFFLATION 14GA 120MM (NEEDLE) ×1 IMPLANT
NEEDLE HYPO 22GX1.5 SAFETY (NEEDLE) ×3 IMPLANT
NEEDLE INSUFFLATION 14GA 120MM (NEEDLE) ×3 IMPLANT
OBTURATOR OPTICAL STANDARD 8MM (TROCAR)
OBTURATOR OPTICAL STND 8 DVNC (TROCAR)
OBTURATOR OPTICALSTD 8 DVNC (TROCAR) IMPLANT
PAD ARMBOARD 7.5X6 YLW CONV (MISCELLANEOUS) ×6 IMPLANT
PENCIL SMOKE EVACUATOR (MISCELLANEOUS) IMPLANT
SCISSORS LAP 5X35 DISP (ENDOMECHANICALS) IMPLANT
SEAL CANN UNIV 5-8 DVNC XI (MISCELLANEOUS) ×2 IMPLANT
SEAL XI 5MM-8MM UNIVERSAL (MISCELLANEOUS) ×6
SET IRRIG TUBING LAPAROSCOPIC (IRRIGATION / IRRIGATOR) IMPLANT
SET TUBE SMOKE EVAC HIGH FLOW (TUBING) ×3 IMPLANT
STAPLER CANNULA SEAL DVNC XI (STAPLE) ×1 IMPLANT
STAPLER CANNULA SEAL XI (STAPLE)
STOPCOCK 4 WAY LG BORE MALE ST (IV SETS) ×5 IMPLANT
SUT MNCRL AB 4-0 PS2 18 (SUTURE) ×3 IMPLANT
SUT VIC AB 2-0 SH 27 (SUTURE)
SUT VIC AB 2-0 SH 27X BRD (SUTURE) ×1 IMPLANT
SUT VLOC 180 0 9IN  GS21 (SUTURE) ×3
SUT VLOC 180 0 9IN GS21 (SUTURE) IMPLANT
SUT VLOC 180 2-0 9IN GS21 (SUTURE) ×1 IMPLANT
SYR 30ML SLIP (SYRINGE) ×3 IMPLANT
SYR TOOMEY 50ML (SYRINGE) ×1 IMPLANT
TOWEL GREEN STERILE FF (TOWEL DISPOSABLE) ×3 IMPLANT
TRAY FOLEY MTR SLVR 14FR STAT (SET/KITS/TRAYS/PACK) IMPLANT
TRAY FOLEY MTR SLVR 16FR STAT (SET/KITS/TRAYS/PACK) ×2 IMPLANT
TRAY LAPAROSCOPIC MC (CUSTOM PROCEDURE TRAY) ×3 IMPLANT
TROCAR XCEL NON-BLD 5MMX100MML (ENDOMECHANICALS) IMPLANT

## 2019-11-03 NOTE — H&P (Signed)
History of Present Illness  The patient is a 29 year old male who presents with an inguinal hernia. Dr. Warrick Parisian is a referring Dr Chief complaint left inguinal hernia  Patient is a 29 year old male, who comes in with a six-month history of a left inguinal discomfort. He states that he describes the discomfort as a stretching sensation. He states this is on and off. He states fairly sporadic. He does do some heavy lifting at home as well as work. He states he has not noticed a bulge the left inguinal area. He's had no signs or symptoms of incarceration or granulation.  Patient had a previous abdominal wall laceration which was repaired. This was just in the subcutaneous space.       Past Surgical History No pertinent past surgical history   Diagnostic Studies History Colonoscopy  never  Allergies  No Known Drug Allergies  [08/18/2019]: Allergies Reconciled   Medication History No Current Medications Medications Reconciled  Social History  Alcohol use  Occasional alcohol use. Caffeine use  Carbonated beverages, Coffee. No drug use  Tobacco use  Current some day smoker.  Family History  Ovarian Cancer  Mother.  Other Problems Anxiety Disorder  Back Pain  Gastroesophageal Reflux Disease  Inguinal Hernia     Review of Systems General Present- Fatigue and Weight Gain. Not Present- Appetite Loss, Chills, Fever, Night Sweats and Weight Loss. HEENT Present- Seasonal Allergies. Not Present- Earache, Hearing Loss, Hoarseness, Nose Bleed, Oral Ulcers, Ringing in the Ears, Sinus Pain, Sore Throat, Visual Disturbances, Wears glasses/contact lenses and Yellow Eyes. Musculoskeletal Present- Back Pain, Joint Stiffness, Muscle Pain and Muscle Weakness. Not Present- Joint Pain and Swelling of Extremities. Neurological Present- Tingling. Not Present- Decreased Memory, Fainting, Headaches, Numbness, Seizures, Tremor, Trouble walking and Weakness. All other  systems negative  BP (!) 145/93   Pulse 93   Temp 97.8 F (36.6 C) (Temporal)   Resp 18   Ht 6' 2.5" (1.892 m)   Wt (!) 168.1 kg   SpO2 99%   BMI 46.95 kg/m      Physical Exam  The physical exam findings are as follows: Note: Constitutional: No acute distress, conversant, appears stated age  Eyes: Anicteric sclerae, moist conjunctiva, no lid lag  Neck: No thyromegaly, trachea midline, no cervical lymphadenopathy  Lungs: Clear to auscultation biilaterally, normal respiratory effot  Cardiovascular: regular rate & rhythm, no murmurs, no peripheal edema, pedal pulses 2+  GI: Soft, no masses or hepatosplenomegaly, non-tender to palpation  MSK: Normal gait, no clubbing cyanosis, edema  Skin: No rashes, palpation reveals normal skin turgor  Psychiatric: Appropriate judgment and insight, oriented to person, place, and time  Abdomen Inspection Hernias - Inguinal hernia - Left - Note: A left inguinal hernia is not palpated with Valsalva, patient's body habitus likely could be the cause.    Assessment & Plan LEFT INGUINAL HERNIA (K40.90) Impression: Patient is a 29 year old male with likely left inguinal hernia. Difficult to assess on physical exam per patient's body habitus.  We'll have him undergo an ultrasound to assess for left inguinal hernia. Cannot palpate a right inguinal hernia.  Ultrasound does show a left inguinal hernia we will proceed with robotic a left inguinal hernia repair with mesh.  All risks and benefits were discussed with the patient to generally include, but not limited to: infection, bleeding, damage to surrounding structures, acute and chronic nerve pain, and recurrence. Alternatives were offered and described. All questions were answered and the patient voiced understanding of the procedure and  wishes to proceed at this point with hernia repair.

## 2019-11-03 NOTE — Anesthesia Postprocedure Evaluation (Signed)
Anesthesia Post Note  Patient: Sergio Wright  Procedure(s) Performed: XI ROBOTIC ASSISTED LEFT INGUINAL HERNIA REPAIR WITH MESH (Left Groin)     Patient location during evaluation: PACU Anesthesia Type: General Level of consciousness: awake and alert Pain management: pain level controlled Vital Signs Assessment: post-procedure vital signs reviewed and stable Respiratory status: spontaneous breathing, nonlabored ventilation, respiratory function stable and patient connected to nasal cannula oxygen Cardiovascular status: blood pressure returned to baseline and stable Postop Assessment: no apparent nausea or vomiting Anesthetic complications: no   No complications documented.  Last Vitals:  Vitals:   11/03/19 1255 11/03/19 1310  BP: (!) 149/87 (!) 146/80  Pulse: 80 81  Resp: 19 19  Temp:  36.5 C  SpO2: 98% 96%    Last Pain:  Vitals:   11/03/19 1310  TempSrc:   PainSc: Slater Kesean Serviss

## 2019-11-03 NOTE — Op Note (Signed)
**Note Sergio-Identified via Obfuscation** 11/03/2019  11:57 AM  PATIENT:  Sergio Wright  29 y.o. 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 The patient was placed in supine position with bilateral SCDs in place.  A Foley catheter was placed.  The patient was prepped and draped in the usual sterile fashion.  After appropriate anitbiotics were confirmed, a time-out was confirmed and all facts were verified.  At this time a Veress needle technique was used to inspect the abdomen approximately 10 cm from the valgus and the paramedian line. This time a 8 mm robotic trocar was placed into the abdomen. The camera was placed there was no injury to any intra-abdominal organs. A 59mm umbilical port was placed just superior to the umbilicus. An 8 mm port was placed approximately 10 cm lateral to the umbilicus on the left paramedian side.  Robot was positioned over the patient and the ports were docked in the usual fashion.  At this time the left-sided peritoneum was taken down from the medial umbilical ligament laterally. The pre-peritoneal space was entered. Dissection was taken down to Cooper's ligament. At this time it was apparent there was a moderate indirect hernia. At this time proceeded clean off  Cooper's ligament and the medial to lateral direction. A proceeded laterally to dissect the spermatic cord. The spermatic cord was  circumferentially dissected away from the surrounding musculature and tissue. The vas deferens was identified and protected all portions of the case. There was a moderate indirect hernia at the base of the spermatic cord. This was dissected back. There was a large cord lipoma that was dissected out of the inguinal ring.  At this time I proceeded to create a pocket laterally for the mesh. Once the pocket was created the peritoneum was stripped back to approximately the base of the cord. At this time the Left-sided XL Covidien progrip  mesh was placed in the abdomen. This covered both the direct and indirect spaces appropriately. This also covered  The femoral space. The mesh lay flat from medial to lateral. At this time a 2-0 V- lock stitch was used to close the peritoneum in a standard running fashion.  At this time the robot was undocked. The right lower port site was reapproximated using a 0 Vicryl via an Endo Close device 1. All ports were removed. The skin was reapproximated and all port sites using 4-0 Monocryl subcuticular fashion.  The patient the procedure well was taken to the recovery     PLAN OF CARE: Discharge to home after PACU  PATIENT DISPOSITION:  PACU - hemodynamically stable.   Delay start of Pharmacological VTE agent (>24hrs) due to surgical blood loss or risk of bleeding: not applicable

## 2019-11-03 NOTE — Transfer of Care (Signed)
Immediate Anesthesia Transfer of Care Note  Patient: Sergio Wright  Procedure(s) Performed: XI ROBOTIC ASSISTED LEFT INGUINAL HERNIA REPAIR WITH MESH (Left Groin)  Patient Location: PACU  Anesthesia Type:General  Level of Consciousness: awake, alert , oriented and patient cooperative  Airway & Oxygen Therapy: Patient Spontanous Breathing and Patient connected to nasal cannula oxygen  Post-op Assessment: Report given to RN and Post -op Vital signs reviewed and stable  Post vital signs: Reviewed and stable  Last Vitals:  Vitals Value Taken Time  BP    Temp    Pulse    Resp    SpO2      Last Pain:  Vitals:   11/03/19 0919  TempSrc:   PainSc: 0-No pain      Patients Stated Pain Goal: 3 (74/82/70 7867)  Complications: No complications documented.

## 2019-11-03 NOTE — Anesthesia Preprocedure Evaluation (Addendum)
Anesthesia Evaluation  Patient identified by MRN, date of birth, ID band Patient awake    Reviewed: Allergy & Precautions, NPO status , Patient's Chart, lab work & pertinent test results  Airway Mallampati: II  TM Distance: >3 FB Neck ROM: Full    Dental  (+) Teeth Intact, Dental Advisory Given   Pulmonary Current Smoker and Patient abstained from smoking.,    breath sounds clear to auscultation       Cardiovascular negative cardio ROS   Rhythm:Regular Rate:Normal     Neuro/Psych  Neuromuscular disease negative psych ROS   GI/Hepatic negative GI ROS, Neg liver ROS,   Endo/Other  negative endocrine ROS  Renal/GU negative Renal ROS     Musculoskeletal negative musculoskeletal ROS (+)   Abdominal (+) + obese,   Peds  Hematology negative hematology ROS (+)   Anesthesia Other Findings   Reproductive/Obstetrics                            Anesthesia Physical Anesthesia Plan  ASA: III  Anesthesia Plan: General   Post-op Pain Management:    Induction: Intravenous, Rapid sequence and Cricoid pressure planned  PONV Risk Score and Plan: 2 and Ondansetron, Dexamethasone and Midazolam  Airway Management Planned: Oral ETT  Additional Equipment: None  Intra-op Plan:   Post-operative Plan: Extubation in OR  Informed Consent: I have reviewed the patients History and Physical, chart, labs and discussed the procedure including the risks, benefits and alternatives for the proposed anesthesia with the patient or authorized representative who has indicated his/her understanding and acceptance.     Dental advisory given  Plan Discussed with: CRNA  Anesthesia Plan Comments:        Anesthesia Quick Evaluation

## 2019-11-03 NOTE — Anesthesia Procedure Notes (Signed)
Procedure Name: Intubation Date/Time: 11/03/2019 10:36 AM Performed by: Shirlyn Goltz, CRNA Pre-anesthesia Checklist: Patient identified, Emergency Drugs available, Suction available and Patient being monitored Patient Re-evaluated:Patient Re-evaluated prior to induction Oxygen Delivery Method: Circle system utilized Preoxygenation: Pre-oxygenation with 100% oxygen Induction Type: IV induction Ventilation: Mask ventilation without difficulty Laryngoscope Size: Mac and 4 Grade View: Grade I Tube type: Oral Tube size: 7.5 mm Number of attempts: 1 Airway Equipment and Method: Stylet Placement Confirmation: ETT inserted through vocal cords under direct vision,  positive ETCO2 and breath sounds checked- equal and bilateral Secured at: 22 cm Tube secured with: Tape Dental Injury: Teeth and Oropharynx as per pre-operative assessment

## 2019-11-03 NOTE — Discharge Instructions (Signed)
[facility] _______[facility] °INGUINAL HERNIA REPAIR: POST OP INSTRUCTIONS ° °Always review your discharge instruction sheet given to you by the facility where your surgery was performed. °IF YOU HAVE DISABILITY OR FAMILY LEAVE FORMS, YOU MUST BRING THEM TO THE OFFICE FOR PROCESSING.   °DO NOT GIVE THEM TO YOUR DOCTOR. ° °1. A  prescription for pain medication may be given to you upon discharge.  Take your pain medication as prescribed, if needed.  If narcotic pain medicine is not needed, then you may take acetaminophen (Tylenol) or ibuprofen (Advil) as needed. °2. Take your usually prescribed medications unless otherwise directed. °If you need a refill on your pain medication, please contact your pharmacy.  They will contact our office to request authorization. Prescriptions will not be filled after 5 pm or on week-ends. °3. You should follow a light diet the first 24 hours after arrival home, such as soup and crackers, etc.  Be sure to include lots of fluids daily.  Resume your normal diet the day after surgery. °4.Most patients will experience some swelling and bruising around the umbilicus or in the groin and scrotum.  Ice packs and reclining will help.  Swelling and bruising can take several days to resolve.  °6. It is common to experience some constipation if taking pain medication after surgery.  Increasing fluid intake and taking a stool softener (such as Colace) will usually help or prevent this problem from occurring.  A mild laxative (Milk of Magnesia or Miralax) should be taken according to package directions if there are no bowel movements after 48 hours. °7. Unless discharge instructions indicate otherwise, you may remove your bandages 24-48 hours after surgery, and you may shower at that time.  You may have steri-strips (small skin tapes) in place directly over the incision.  These strips should be left on the skin for 7-10 days.  If your surgeon used skin glue on the incision, you may  shower in 24 hours.  The glue will flake off over the next 2-3 weeks.  Any sutures or staples will be removed at the office during your follow-up visit. °8. ACTIVITIES:  You may resume regular (light) daily activities beginning the next day--such as daily self-care, walking, climbing stairs--gradually increasing activities as tolerated.  You may have sexual intercourse when it is comfortable.  Refrain from any heavy lifting or straining until approved by your doctor. ° °a.You may drive when you are no longer taking prescription pain medication, you can comfortably wear a seatbelt, and you can safely maneuver your car and apply brakes. °b.RETURN TO WORK:   °_____________________________________________ ° °9.You should see your doctor in the office for a follow-up appointment approximately 2-3 weeks after your surgery.  Make sure that you call for this appointment within a day or two after you arrive home to insure a convenient appointment time. °10.OTHER INSTRUCTIONS: _________________________ °   _____________________________________ ° °WHEN TO CALL YOUR DOCTOR: °1. Fever over 101.0 °2. Inability to urinate °3. Nausea and/or vomiting °4. Extreme swelling or bruising °5. Continued bleeding from incision. °6. Increased pain, redness, or drainage from the incision ° °The clinic staff is available to answer your questions during regular business hours.  Please don’t hesitate to call and ask to speak to one of the nurses for clinical concerns.  If you have a medical emergency, go to the nearest emergency room or call 911.  A surgeon from [facility] is always on call at the hospital ° ° °[address], [address], [place], [place]  [address] ? ° [address] [address], [place], [place]   [address] °[phone] ? [phone] ? FAX [phone] °Web site: [email or web address] ° °

## 2019-11-05 ENCOUNTER — Encounter (HOSPITAL_COMMUNITY): Payer: Self-pay | Admitting: General Surgery

## 2020-02-15 ENCOUNTER — Encounter: Payer: Self-pay | Admitting: Family

## 2020-02-15 ENCOUNTER — Ambulatory Visit (INDEPENDENT_AMBULATORY_CARE_PROVIDER_SITE_OTHER): Payer: 59 | Admitting: Family

## 2020-02-15 DIAGNOSIS — U071 COVID-19: Secondary | ICD-10-CM

## 2020-02-15 DIAGNOSIS — Z20822 Contact with and (suspected) exposure to covid-19: Secondary | ICD-10-CM | POA: Diagnosis not present

## 2020-02-15 MED ORDER — DEXAMETHASONE 6 MG PO TABS: 6.0000 mg | ORAL_TABLET | Freq: Two times a day (BID) | ORAL | 0 refills | Status: DC

## 2020-02-15 NOTE — Progress Notes (Signed)
   Virtual Visit via telephone Note Due to COVID-19 pandemic this visit was conducted virtually. This visit type was conducted due to national recommendations for restrictions regarding the COVID-19 Pandemic (e.g. social distancing, sheltering in place) in an effort to limit this patient's exposure and mitigate transmission in our community. All issues noted in this document were discussed and addressed.  A physical exam was not performed with this format.  I connected with [name] on 02/15/20 at 12:51 pm  by telephone and verified that I am speaking with the correct person using two identifiers. Sergio Wright is currently located at home and no one is currently with him during visit. The provider, Evelina Dun, FNP is located in their office at time of visit.  I discussed the limitations, risks, security and privacy concerns of performing an evaluation and management service by telephone and the availability of in person appointments. I also discussed with the patient that there may be a patient responsible charge related to this service. The patient expressed understanding and agreed to proceed.   History and Present Illness:   PT calls the office today with sinus congestion. He had a negative at home COVID test.   Sinusitis This is a new problem. The current episode started in the past 7 days. The problem has been gradually worsening since onset. There has been no fever. His pain is at a severity of 6/10. The pain is moderate. Associated symptoms include congestion, coughing, headaches, shortness of breath, sinus pressure and a sore throat. Pertinent negatives include no ear pain, hoarse voice, sneezing or swollen glands. Past treatments include oral decongestants. The treatment provided mild relief.      Review of Systems  HENT: Positive for congestion, sinus pressure and sore throat. Negative for ear pain, hoarse voice and sneezing.   Respiratory: Positive for cough and shortness of  breath.   Neurological: Positive for headaches.  All other systems reviewed and are negative.    Observations/Objective: No SOB or distress noted   Assessment and Plan: 1. Telehealth encounter for confirmed COVID-19 Rest Force fluids Quarantine until results return from COVID test Mucinex as needed  - dexamethasone (DECADRON) 6 MG tablet; Take 1 tablet (6 mg total) by mouth 2 (two) times daily with a meal.  Dispense: 10 tablet; Refill: 0 - Novel Coronavirus, NAA ([facility])     I discussed the assessment and treatment plan with the patient. The patient was provided an opportunity to ask questions and all were answered. The patient agreed with the plan and demonstrated an understanding of the instructions.   The patient was advised to call back or seek an in-person evaluation if the symptoms worsen or if the condition fails to improve as anticipated.  The above assessment and management plan was discussed with the patient. The patient verbalized understanding of and has agreed to the management plan. Patient is aware to call the clinic if symptoms persist or worsen. Patient is aware when to return to the clinic for a follow-up visit. Patient educated on when it is appropriate to go to the emergency department.   Time call ended:  1:02 pm   I provided 11 minutes of non-face-to-face time during this encounter.    Evelina Dun, FNP

## 2020-02-16 LAB — SARS-COV-2, NAA 2 DAY TAT

## 2020-02-16 LAB — NOVEL CORONAVIRUS, NAA: SARS-CoV-2, NAA: NOT DETECTED

## 2020-08-26 ENCOUNTER — Other Ambulatory Visit: Payer: Self-pay

## 2020-08-26 ENCOUNTER — Ambulatory Visit (INDEPENDENT_AMBULATORY_CARE_PROVIDER_SITE_OTHER): Payer: 59 | Admitting: Nurse Practitioner

## 2020-08-26 ENCOUNTER — Encounter: Payer: Self-pay | Admitting: Nurse Practitioner

## 2020-08-26 VITALS — BP 132/87 | HR 88 | Temp 97.8°F | Ht 74.0 in | Wt 358.0 lb

## 2020-08-26 DIAGNOSIS — K219 Gastro-esophageal reflux disease without esophagitis: Secondary | ICD-10-CM | POA: Insufficient documentation

## 2020-08-26 MED ORDER — PANTOPRAZOLE SODIUM 40 MG PO TBEC: 40.0000 mg | DELAYED_RELEASE_TABLET | Freq: Every day | ORAL | 1 refills | Status: DC

## 2020-08-26 NOTE — Assessment & Plan Note (Addendum)
Symptoms of GERD not well controlled.  Patient currently is not on any prescribed medication but uses Tums over-the-counter which provides very minimal relief.  Patient reports taking leftover Zantac that provided better relief of symptoms.  I provided education to patient presented and is on recall.  Started patient on 40 mg Protonix tablet by mouth daily.  Increase hydration, sit up after every meal, diet and exercise and weight loss recommended.  Decrease caffeine, reduce or quit smoking, decrease fried foods.  Printed handouts given to patient.  Follow-up with worsening unresolved symptoms.  Rx sent to pharmacy.

## 2020-08-26 NOTE — Progress Notes (Signed)
Acute Office Visit  Subjective:    Patient ID: Sergio Wright, male    DOB: September 20, 1990, 30 y.o.   MRN: [id number]  No chief complaint on file.   Gastroesophageal Reflux He complains of belching, chest pain and coughing. He reports no sore throat. This is a recurrent problem. The problem occurs constantly. The problem has been gradually worsening. The symptoms are aggravated by certain foods and smoking. Pertinent negatives include no anemia or muscle weakness. Risk factors include obesity. He has tried an antacid for the symptoms. The treatment provided mild relief. Past invasive treatments do not include reflux surgery.    No past medical history on file.  Past Surgical History:  Procedure Laterality Date   FRACTURE SURGERY     see Care Everywhere for procedure   XI ROBOTIC ASSISTED INGUINAL HERNIA REPAIR WITH MESH Left 11/03/2019   Procedure: XI ROBOTIC ASSISTED LEFT INGUINAL HERNIA REPAIR WITH MESH;  Surgeon: Ralene Ok, MD;  Location: [facility];  Service: General;  Laterality: Left;    Family History  Problem Relation Age of Onset   Kidney Stones Father     Social History   Socioeconomic History   Marital status: Single    Spouse name: Not on file   Number of children: Not on file   Years of education: Not on file   Highest education level: Not on file  Occupational History   Not on file  Tobacco Use   Smoking status: Every Day    Packs/day: 0.25    Pack years: 0.00    Types: Cigarettes   Smokeless tobacco: Never  Vaping Use   Vaping Use: Every day  Substance and Sexual Activity   Alcohol use: Yes    Alcohol/week: 12.0 standard drinks    Types: 12 Cans of beer per week   Drug use: No   Sexual activity: Not on file  Other Topics Concern   Not on file  Social History Narrative   Not on file   Social Determinants of Health   Financial Resource Strain: Not on file  Food Insecurity: Not on file  Transportation Needs: Not on file  Physical Activity: Not  on file  Stress: Not on file  Social Connections: Not on file  Intimate Partner Violence: Not on file    Outpatient Medications Prior to Visit  Medication Sig Dispense Refill   dexamethasone (DECADRON) 6 MG tablet Take 1 tablet (6 mg total) by mouth 2 (two) times daily with a meal. 10 tablet 0   No facility-administered medications prior to visit.     Review of Systems  HENT:  Negative for sore throat.   Respiratory:  Positive for cough.   Cardiovascular:  Positive for chest pain.  Musculoskeletal:  Negative for muscle weakness.  All other systems reviewed and are negative.     Objective:    Physical Exam Vitals and nursing note reviewed.  Constitutional:      Appearance: He is obese.  HENT:     Head: Normocephalic.     Nose: Nose normal.  Eyes:     Conjunctiva/sclera: Conjunctivae normal.  Cardiovascular:     Rate and Rhythm: Normal rate and regular rhythm.     Pulses: Normal pulses.     Heart sounds: Normal heart sounds.  Pulmonary:     Effort: Pulmonary effort is normal.     Breath sounds: Normal breath sounds.  Abdominal:     General: Bowel sounds are normal.  Neurological:  Mental Status: He is alert.  Psychiatric:        Behavior: Behavior normal.    BP 132/87   Pulse 88   Temp 97.8 F (36.6 C) (Temporal)   Ht 6\' 2"  (1.88 m)   Wt (!) 358 lb (162.4 kg)   SpO2 97%   BMI 45.96 kg/m  Wt Readings from Last 3 Encounters:  08/26/20 (!) 358 lb (162.4 kg)  11/03/19 (!) 370 lb 9.6 oz (168.1 kg)  10/30/19 (!) 370 lb 9.6 oz (168.1 kg)    Health Maintenance Due  Topic Date Due   Pneumococcal Vaccine 47-78 Years old (1 - PCV) Never done   Hepatitis C Screening  Never done   COVID-19 Vaccine (2 - Moderna series) 12/14/2019    There are no preventive care reminders to display for this patient.   No results found for: TSH Lab Results  Component Value Date   WBC 8.3 10/30/2019   HGB 15.8 10/30/2019   HCT 47.2 10/30/2019   MCV 91.1 10/30/2019   PLT  230 10/30/2019   Lab Results  Component Value Date   NA 138 05/09/2015   K 5.0 05/09/2015   CO2 22 05/09/2015   GLUCOSE 90 05/09/2015   BUN 11 05/09/2015   CREATININE 0.83 05/09/2015   CALCIUM 9.9 05/09/2015    Lab Results  Component Value Date   HGBA1C 5.0 05/09/2015       Assessment & Plan:   Problem List Items Addressed This Visit       Digestive   Gastroesophageal reflux disease without esophagitis - Primary    Symptoms of GERD not well controlled.  Patient currently is not on any prescribed medication but uses Tums over-the-counter which provides very minimal relief.  Patient reports taking leftover Zantac that provided better relief of symptoms.  I provided education to patient presented and is on recall.  Started patient on 40 mg Protonix tablet by mouth daily.  Increase hydration, sit up after every meal, diet and exercise and weight loss recommended.  Decrease caffeine, reduce or quit smoking, decrease fried foods.  Printed handouts given to patient.  Follow-up with worsening unresolved symptoms.  Rx sent to pharmacy.       Relevant Medications   pantoprazole (PROTONIX) 40 MG tablet     Meds ordered this encounter  Medications   pantoprazole (PROTONIX) 40 MG tablet    Sig: Take 1 tablet (40 mg total) by mouth daily.    Dispense:  90 tablet    Refill:  1    Order Specific Question:   Supervising Provider    Answer:   Janora Norlander [[id number]]     Ivy Lynn, NP

## 2020-08-26 NOTE — Patient Instructions (Signed)
Gastroesophageal Reflux Disease, Adult  Gastroesophageal reflux (GER) happens when acid from the stomach flows up into the tube that connects the mouth and the stomach (esophagus). Normally, food travels down the esophagus and stays in the stomach to be digested. With GER, food and stomach acid sometimes move back up into theesophagus. You may have a disease called gastroesophageal reflux disease (GERD) if the reflux: Happens often. Causes frequent or very bad symptoms. Causes problems such as damage to the esophagus. When this happens, the esophagus becomes sore and swollen. Over time, GERD can make small holes (ulcers) in the lining of the esophagus. What are the causes? This condition is caused by a problem with the muscle between the esophagus and the stomach. When this muscle is weak or not normal, it does not close properlyto keep food and acid from coming back up from the stomach. The muscle can be weak because of: Tobacco use. Pregnancy. Having a certain type of hernia (hiatal hernia). Alcohol use. Certain foods and drinks, such as coffee, chocolate, onions, and peppermint. What increases the risk? Being overweight. Having a disease that affects your connective tissue. Taking NSAIDs, such a ibuprofen. What are the signs or symptoms? Heartburn. Difficult or painful swallowing. The feeling of having a lump in the throat. A bitter taste in the mouth. Bad breath. Having a lot of saliva. Having an upset or bloated stomach. Burping. Chest pain. Different conditions can cause chest pain. Make sure you see your doctor if you have chest pain. Shortness of breath or wheezing. A long-term cough or a cough at night. Wearing away of the surface of teeth (tooth enamel). Weight loss. How is this treated? Making changes to your diet. Taking medicine. Having surgery. Treatment will depend on how bad your symptoms are. Follow these instructions at home: Eating and drinking  Follow a  diet as told by your doctor. You may need to avoid foods and drinks such as: Coffee and tea, with or without caffeine. Drinks that contain alcohol. Energy drinks and sports drinks. Bubbly (carbonated) drinks or sodas. Chocolate and cocoa. Peppermint and mint flavorings. Garlic and onions. Horseradish. Spicy and acidic foods. These include peppers, chili powder, curry powder, vinegar, hot sauces, and BBQ sauce. Citrus fruit juices and citrus fruits, such as oranges, lemons, and limes. Tomato-based foods. These include red sauce, chili, salsa, and pizza with red sauce. Fried and fatty foods. These include donuts, french fries, potato chips, and high-fat dressings. High-fat meats. These include hot dogs, rib eye steak, sausage, ham, and bacon. High-fat dairy items, such as whole milk, butter, and cream cheese. Eat small meals often. Avoid eating large meals. Avoid drinking large amounts of liquid with your meals. Avoid eating meals during the 2-3 hours before bedtime. Avoid lying down right after you eat. Do not exercise right after you eat.  Lifestyle  Do not smoke or use any products that contain nicotine or tobacco. If you need help quitting, ask your doctor. Try to lower your stress. If you need help doing this, ask your doctor. If you are overweight, lose an amount of weight that is healthy for you. Ask your doctor about a safe weight loss goal.  General instructions Pay attention to any changes in your symptoms. Take over-the-counter and prescription medicines only as told by your doctor. Do not take aspirin, ibuprofen, or other NSAIDs unless your doctor says it is okay. Wear loose clothes. Do not wear anything tight around your waist. Raise (elevate) the head of your bed about  6 inches (15 cm). You may need to use a wedge to do this. Avoid bending over if this makes your symptoms worse. Keep all follow-up visits. Contact a doctor if: You have new symptoms. You lose weight and  you do not know why. You have trouble swallowing or it hurts to swallow. You have wheezing or a cough that keeps happening. You have a hoarse voice. Your symptoms do not get better with treatment. Get help right away if: You have sudden pain in your arms, neck, jaw, teeth, or back. You suddenly feel sweaty, dizzy, or light-headed. You have chest pain or shortness of breath. You vomit and the vomit is green, yellow, or black, or it looks like blood or coffee grounds. You faint. Your poop (stool) is red, bloody, or black. You cannot swallow, drink, or eat. These symptoms may represent a serious problem that is an emergency. Do not wait to see if the symptoms will go away. Get medical help right away. Call your local emergency services (911 in the [place] Do not drive yourself to the hospital. Summary If a person has gastroesophageal reflux disease (GERD), food and stomach acid move back up into the esophagus and cause symptoms or problems such as damage to the esophagus. Treatment will depend on how bad your symptoms are. Follow a diet as told by your doctor. Take all medicines only as told by your doctor. This information is not intended to replace advice given to you by your health care provider. Make sure you discuss any questions you have with your healthcare provider. Document Revised: 09/14/2019 Document Reviewed: 09/14/2019 Elsevier Patient Education  [employer].

## 2020-11-23 IMAGING — US US PELVIS LIMITED
1 series · 8 of 8 positions shown · non-contrast
Comparison: None.

CLINICAL DATA: Left inguinal hernia.

EXAM:
LIMITED ULTRASOUND OF PELVIS
TECHNIQUE: Limited transabdominal ultrasound examination of the pelvis was
performed.

[Series 1: us pelvis limited · 0.09mm/px · 8 acquisitions, 8 frames shown]
[im 1/8]
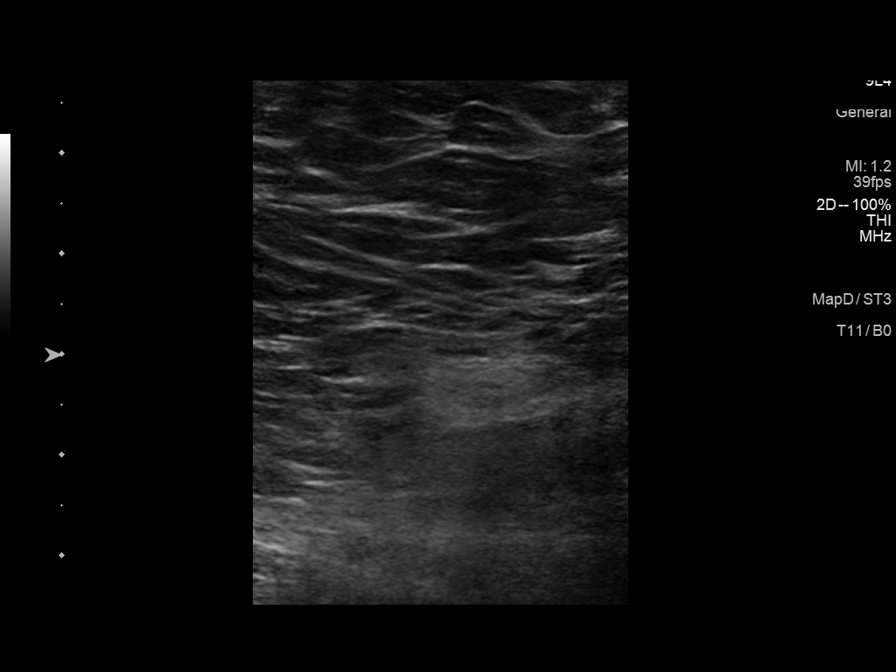
[im 2/8]
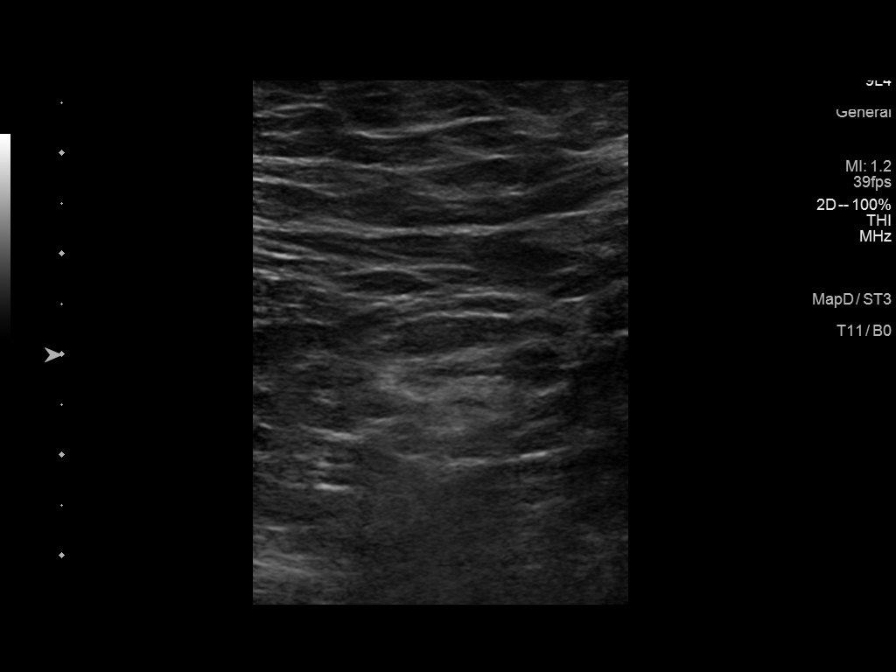
[im 3/8]
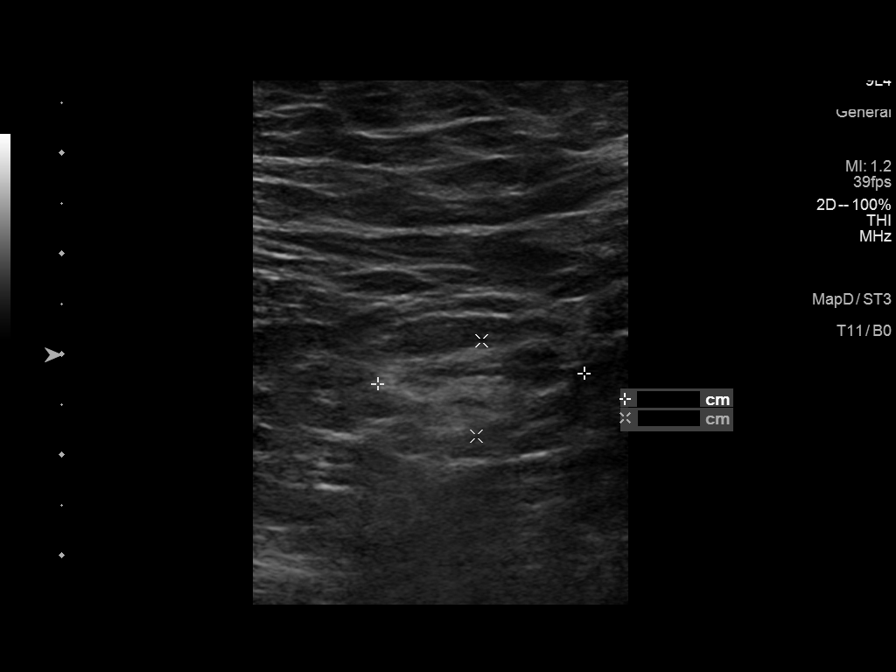
[im 4/8]
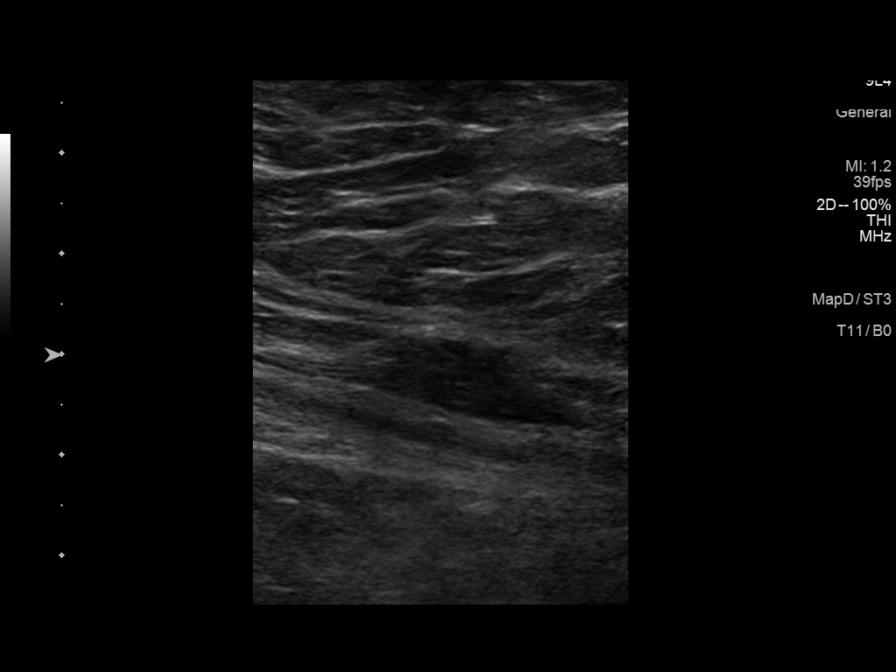
[im 5/8]
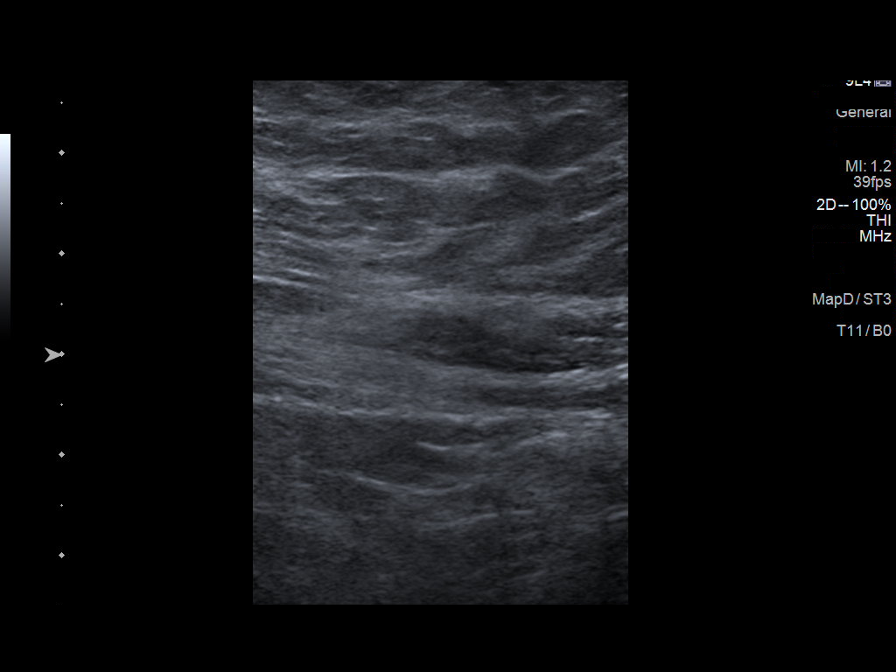
[im 6/8]
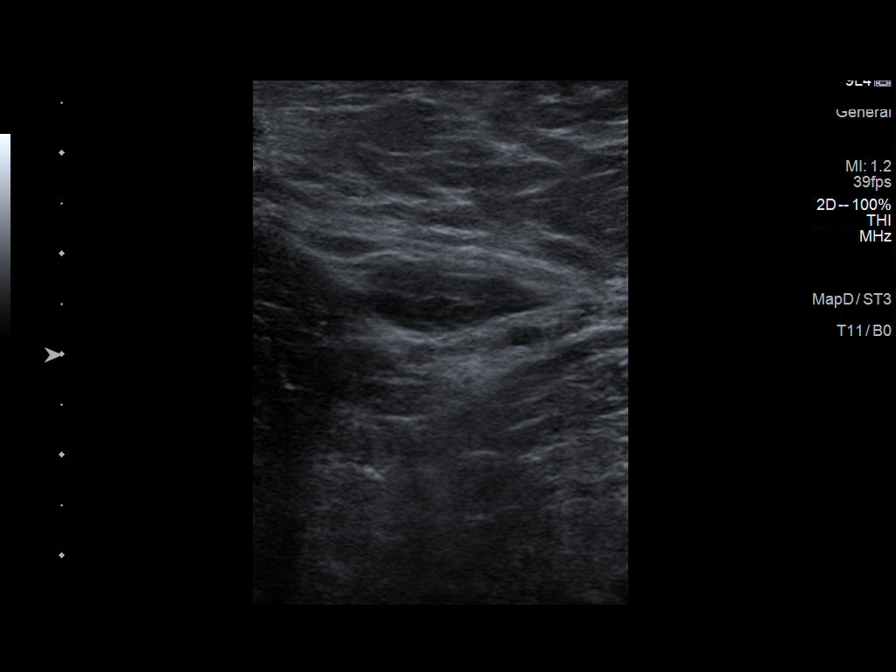
[im 7/8]
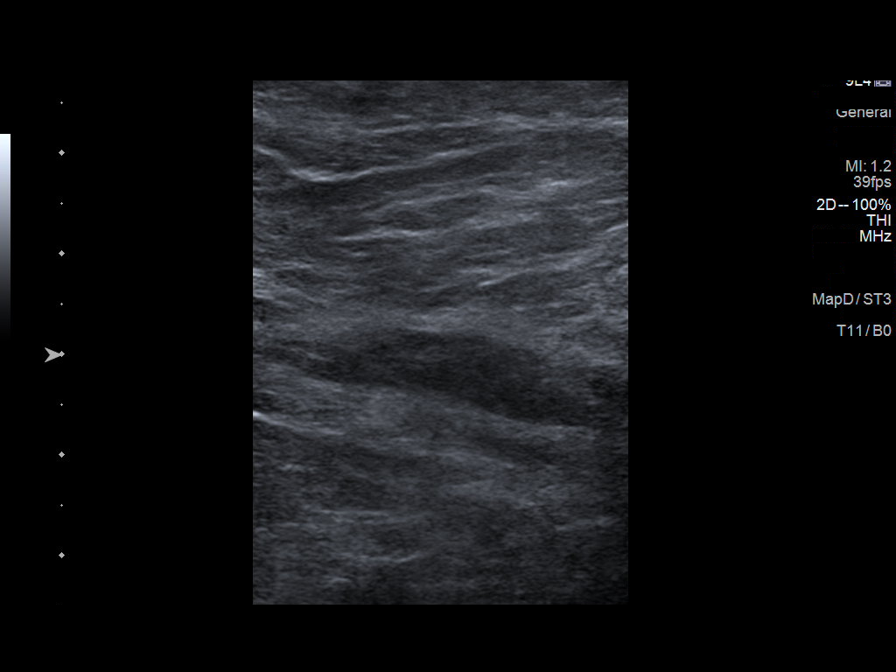
[im 8/8]
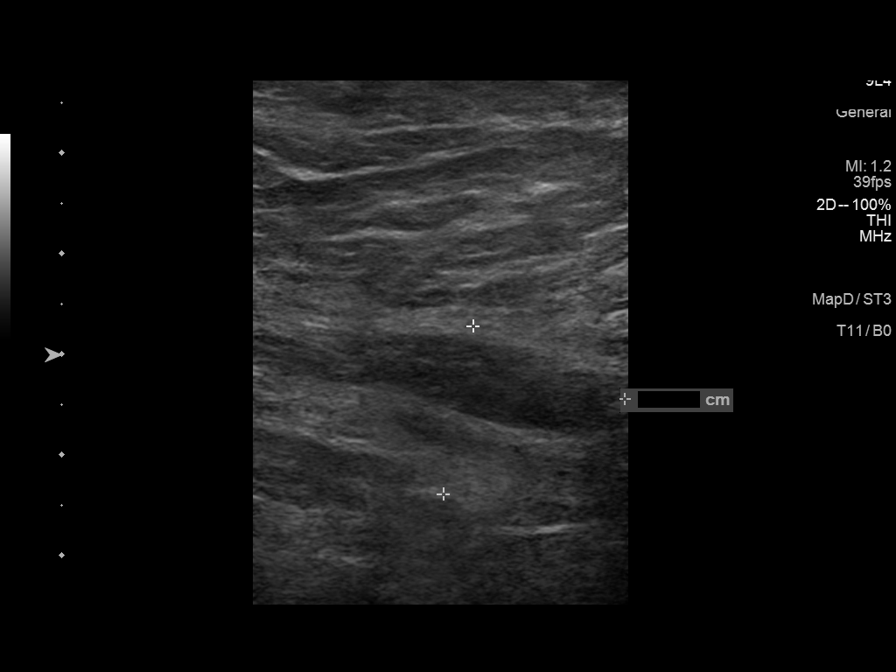

[8 of 8 positions shown; findings below may reference images not displayed]

FINDINGS: Mixed echogenicity within the left inguinal canal measures up to
x 1.0 x 1.7 cm.
IMPRESSION: Probable hernia within the left inguinal canal, suboptimally
evaluated at ultrasound. Consider confirmation with pelvic CT.

## 2021-01-19 ENCOUNTER — Encounter: Payer: Self-pay | Admitting: Family Medicine

## 2021-01-19 ENCOUNTER — Other Ambulatory Visit: Payer: Self-pay

## 2021-01-19 ENCOUNTER — Ambulatory Visit (INDEPENDENT_AMBULATORY_CARE_PROVIDER_SITE_OTHER): Payer: 59 | Admitting: Family Medicine

## 2021-01-19 VITALS — BP 139/86 | HR 100 | Temp 98.1°F | Ht 74.0 in | Wt 322.0 lb

## 2021-01-19 DIAGNOSIS — B372 Candidiasis of skin and nail: Secondary | ICD-10-CM

## 2021-01-19 DIAGNOSIS — K648 Other hemorrhoids: Secondary | ICD-10-CM

## 2021-01-19 MED ORDER — NYSTATIN 100000 UNIT/GM EX CREA: 1.0000 "application " | TOPICAL_CREAM | Freq: Two times a day (BID) | CUTANEOUS | 0 refills | Status: AC

## 2021-01-19 NOTE — Progress Notes (Signed)
Acute Office Visit  Subjective:    Patient ID: CHRISHUN SCHEER, male    DOB: 1990-06-05, 30 y.o.   MRN: [id number]  Chief Complaint  Patient presents with   Rectal Pain    HPI Patient is in today for rectal pain that has been intermittent for the last month. He has been struggling with constipation for the last few weeks. He has been taking miralax and metamucil for 1 week. He reports that his bowel movements have been less frequent but not necessarily that he has had to strain. He did have to do a "clean out" 2 weeks ago with prune juice for constipation. He did see a small amount of bright red blood when wiping over a bowel movement a few days ago but this has been the only occurrence. He has tried preparation H cream and suppositories, aloe vera, vitamin E, and witch hazel. The suppositories were somewhat helpful. Prep H cream caused more burning and pain. He has had rectal itching for the last few days. He denies changes in his appetite, unintended weight loss, abdominal pain, nausea, vomiting, diarrhea, or fever.   No past medical history on file.  Past Surgical History:  Procedure Laterality Date   FRACTURE SURGERY     see Care Everywhere for procedure   XI ROBOTIC ASSISTED INGUINAL HERNIA REPAIR WITH MESH Left 11/03/2019   Procedure: XI ROBOTIC ASSISTED LEFT INGUINAL HERNIA REPAIR WITH MESH;  Surgeon: Ralene Ok, MD;  Location: [facility];  Service: General;  Laterality: Left;    Family History  Problem Relation Age of Onset   Kidney Stones Father     Social History   Socioeconomic History   Marital status: Single    Spouse name: Not on file   Number of children: Not on file   Years of education: Not on file   Highest education level: Not on file  Occupational History   Not on file  Tobacco Use   Smoking status: Every Day    Packs/day: 0.25    Types: Cigarettes   Smokeless tobacco: Never  Vaping Use   Vaping Use: Every day  Substance and Sexual Activity    Alcohol use: Yes    Alcohol/week: 12.0 standard drinks    Types: 12 Cans of beer per week   Drug use: No   Sexual activity: Not on file  Other Topics Concern   Not on file  Social History Narrative   Not on file   Social Determinants of Health   Financial Resource Strain: Not on file  Food Insecurity: Not on file  Transportation Needs: Not on file  Physical Activity: Not on file  Stress: Not on file  Social Connections: Not on file  Intimate Partner Violence: Not on file    Outpatient Medications Prior to Visit  Medication Sig Dispense Refill   acetaminophen (TYLENOL) 500 MG tablet Take 1,000 mg by mouth every 6 (six) hours as needed.     phentermine (ADIPEX-P) 37.5 MG tablet Take 37.5 mg by mouth daily.     Polyethylene Glycol 3350 (MIRALAX PO) Take by mouth in the morning and at bedtime.     psyllium (METAMUCIL) 58.6 % packet Take 1 packet by mouth daily.     dexamethasone (DECADRON) 6 MG tablet Take 1 tablet (6 mg total) by mouth 2 (two) times daily with a meal. 10 tablet 0   pantoprazole (PROTONIX) 40 MG tablet Take 1 tablet (40 mg total) by mouth daily. 90 tablet 1  No facility-administered medications prior to visit.    No Known Allergies  Review of Systems As per HPI.     Objective:    Physical Exam Vitals and nursing note reviewed.  Constitutional:      Appearance: Normal appearance.  HENT:     Head: Normocephalic and atraumatic.  Pulmonary:     Effort: Pulmonary effort is normal. No respiratory distress.  Genitourinary:    Rectum: Internal hemorrhoid (small, non tender) present. No anal fissure or external hemorrhoid. Normal anal tone.     Comments: Skin surrounding rectum is erythematous.  Skin:    General: Skin is warm and dry.  Neurological:     General: No focal deficit present.     Mental Status: He is alert and oriented to person, place, and time.    BP 139/86   Pulse 100   Temp 98.1 F (36.7 C) (Temporal)   Ht 6\' 2"  (1.88 m)   Wt (!) 322  lb (146.1 kg)   BMI 41.34 kg/m  Wt Readings from Last 3 Encounters:  01/19/21 (!) 322 lb (146.1 kg)  08/26/20 (!) 358 lb (162.4 kg)  11/03/19 (!) 370 lb 9.6 oz (168.1 kg)    Health Maintenance Due  Topic Date Due   Pneumococcal Vaccine 65-96 Years old (1 - PCV) Never done   Hepatitis C Screening  Never done   COVID-19 Vaccine (2 - Moderna series) 12/14/2019    There are no preventive care reminders to display for this patient.   No results found for: TSH Lab Results  Component Value Date   WBC 8.3 10/30/2019   HGB 15.8 10/30/2019   HCT 47.2 10/30/2019   MCV 91.1 10/30/2019   PLT 230 10/30/2019   Lab Results  Component Value Date   NA 138 05/09/2015   K 5.0 05/09/2015   CO2 22 05/09/2015   GLUCOSE 90 05/09/2015   BUN 11 05/09/2015   CREATININE 0.83 05/09/2015   CALCIUM 9.9 05/09/2015   No results found for: CHOL No results found for: HDL No results found for: LDLCALC No results found for: TRIG No results found for: [facility] Lab Results  Component Value Date   HGBA1C 5.0 05/09/2015       Assessment & Plan:   Quinton was seen today for rectal pain.  Diagnoses and all orders for this visit:  Yeast dermatitis Rectal. Nystatin as below. Keep area dry. Discussed this can be cause of pain, burning, and itching and would explain why preparation H cream worsens symptoms.  -     nystatin cream (MYCOSTATIN); Apply 1 application topically 2 (two) times daily.  Internal hemorrhoid  Very small. Continue treatment for constipation. Discussed that symptoms are likely due to yeast dermatitis.   Return to office for new or worsening symptoms, or if symptoms persist.   The patient indicates understanding of these issues and agrees with the plan.  Gwenlyn Perking, FNP

## 2021-01-19 NOTE — Patient Instructions (Signed)

## 2021-10-01 ENCOUNTER — Encounter: Payer: Self-pay | Admitting: Family Medicine

## 2021-10-02 MED ORDER — HYDROXYZINE PAMOATE 25 MG PO CAPS: 25.0000 mg | ORAL_CAPSULE | Freq: Three times a day (TID) | ORAL | 0 refills | Status: AC | PRN

## 2022-02-19 ENCOUNTER — Encounter: Payer: Self-pay | Admitting: Family Medicine

## 2022-02-19 ENCOUNTER — Ambulatory Visit (INDEPENDENT_AMBULATORY_CARE_PROVIDER_SITE_OTHER): Payer: 59 | Admitting: Family Medicine

## 2022-02-19 VITALS — BP 130/89 | HR 71 | Temp 98.4°F | Ht 74.0 in | Wt 321.0 lb

## 2022-02-19 DIAGNOSIS — Z6841 Body Mass Index (BMI) 40.0 and over, adult: Secondary | ICD-10-CM

## 2022-02-19 DIAGNOSIS — Z0000 Encounter for general adult medical examination without abnormal findings: Secondary | ICD-10-CM | POA: Diagnosis not present

## 2022-02-19 NOTE — Progress Notes (Signed)
BP 130/89   Pulse 71   Temp 98.4 F (36.9 C)   Ht _0  (1.88 m)   Wt (!) 321 lb (145.6 kg)   SpO2 99%   BMI 41.21 kg/m    Subjective:   Patient ID: Sergio Wright, male    DOB: Dec 30, 1990, 31 y.o.   MRN: [id number]  HPI: Sergio Wright is a 31 y.o. male presenting on 02/19/2022 for Medical Management of Chronic Issues (CPE)   HPI Physical exam Patient denies any chest pain, shortness of breath, headaches or vision issues, abdominal complaints, diarrhea, nausea, vomiting, or joint issues.  He is working with the weight loss clinic and using phentermine and has come down 80 pounds in the past year and a half he says.  He is feeling a lot better and he is also exercising and changing diet and doing very well with that.  Relevant past medical, surgical, family and social history reviewed and updated as indicated. Interim medical history since our last visit reviewed. Allergies and medications reviewed and updated.  Review of Systems  Constitutional:  Negative for chills and fever.  HENT:  Negative for ear pain and tinnitus.   Eyes:  Negative for pain and discharge.  Respiratory:  Negative for cough, shortness of breath and wheezing.   Cardiovascular:  Negative for chest pain, palpitations and leg swelling.  Gastrointestinal:  Negative for abdominal pain, blood in stool, constipation and diarrhea.  Genitourinary:  Negative for dysuria and hematuria.  Musculoskeletal:  Negative for back pain, gait problem and myalgias.  Skin:  Negative for rash.  Neurological:  Negative for dizziness, weakness and headaches.  Psychiatric/Behavioral:  Negative for suicidal ideas.   All other systems reviewed and are negative.   Per HPI unless specifically indicated above   Allergies as of 02/19/2022   No Known Allergies      Medication List        Accurate as of February 19, 2022  3:46 PM. If you have any questions, ask your nurse or doctor.          STOP taking these medications     psyllium 58.6 % packet Commonly known as: METAMUCIL Stopped by: Fransisca Kaufmann Emmet Messer, MD       TAKE these medications    acetaminophen 500 MG tablet Commonly known as: TYLENOL Take 1,000 mg by mouth every 6 (six) hours as needed.   hydrOXYzine 25 MG capsule Commonly known as: VISTARIL Take 1 capsule (25 mg total) by mouth every 8 (eight) hours as needed.   MIRALAX PO Take by mouth in the morning and at bedtime.   nystatin cream Commonly known as: MYCOSTATIN Apply 1 application topically 2 (two) times daily.   phentermine 37.5 MG tablet Commonly known as: ADIPEX-P Take 37.5 mg by mouth daily.         Objective:   BP 130/89   Pulse 71   Temp 98.4 F (36.9 C)   Ht _1  (1.88 m)   Wt (!) 321 lb (145.6 kg)   SpO2 99%   BMI 41.21 kg/m   Wt Readings from Last 3 Encounters:  02/19/22 (!) 321 lb (145.6 kg)  01/19/21 (!) 322 lb (146.1 kg)  08/26/20 (!) 358 lb (162.4 kg)    Physical Exam Vitals reviewed.  Constitutional:      General: He is not in acute distress.    Appearance: He is well-developed. He is not diaphoretic.  HENT:     Right Ear: External ear  normal.     Left Ear: External ear normal.     Nose: Nose normal.     Mouth/Throat:     Pharynx: No oropharyngeal exudate.  Eyes:     General: No scleral icterus.    Conjunctiva/sclera: Conjunctivae normal.  Neck:     Thyroid: No thyromegaly.  Cardiovascular:     Rate and Rhythm: Normal rate and regular rhythm.     Heart sounds: Normal heart sounds. No murmur heard. Pulmonary:     Effort: Pulmonary effort is normal. No respiratory distress.     Breath sounds: Normal breath sounds. No wheezing.  Abdominal:     General: Bowel sounds are normal. There is no distension.     Palpations: Abdomen is soft.     Tenderness: There is no abdominal tenderness. There is no guarding or rebound.  Musculoskeletal:        General: No swelling. Normal range of motion.     Cervical back: Neck supple.   Lymphadenopathy:     Cervical: No cervical adenopathy.  Skin:    General: Skin is warm and dry.     Findings: No rash.  Neurological:     Mental Status: He is alert and oriented to person, place, and time.     Coordination: Coordination normal.  Psychiatric:        Behavior: Behavior normal.       Assessment & Plan:   Problem List Items Addressed This Visit       Other   BMI 40.0-44.9, adult ([facility])   Relevant Orders   CBC with Differential/Platelet   CMP14+EGFR   Lipid panel   Other Visit Diagnoses     Well adult exam    -  Primary   Relevant Orders   CBC with Differential/Platelet   CMP14+EGFR   Lipid panel     Seems to be doing well he will continue with the weight loss clinic but from RN no changes, will do blood work today  Follow up plan: Return in about 1 year (around 02/20/2023), or if symptoms worsen or fail to improve, for Physical exam.  Counseling provided for all of the vaccine components Orders Placed This Encounter  Procedures   CBC with Differential/Platelet   CMP14+EGFR   Lipid panel    Caryl Pina, MD [facility] 02/19/2022, 3:46 PM

## 2022-02-20 LAB — CBC WITH DIFFERENTIAL/PLATELET
Basophils Absolute: 0.1 10*3/uL (ref 0.0–0.2)
Basos: 1 %
EOS (ABSOLUTE): 0.2 10*3/uL (ref 0.0–0.4)
Eos: 2 %
Hematocrit: 45.6 % (ref 37.5–51.0)
Hemoglobin: 15.6 g/dL (ref 13.0–17.7)
Immature Grans (Abs): 0 10*3/uL (ref 0.0–0.1)
Immature Granulocytes: 0 %
Lymphocytes Absolute: 2.5 10*3/uL (ref 0.7–3.1)
Lymphs: 27 %
MCH: 30.3 pg (ref 26.6–33.0)
MCHC: 34.2 g/dL (ref 31.5–35.7)
MCV: 89 fL (ref 79–97)
Monocytes Absolute: 0.5 10*3/uL (ref 0.1–0.9)
Monocytes: 5 %
Neutrophils Absolute: 6.3 10*3/uL (ref 1.4–7.0)
Neutrophils: 65 %
Platelets: 233 10*3/uL (ref 150–450)
RBC: 5.15 x10E6/uL (ref 4.14–5.80)
RDW: 12.3 % (ref 11.6–15.4)
WBC: 9.6 10*3/uL (ref 3.4–10.8)

## 2022-02-20 LAB — CMP14+EGFR
ALT: 26 IU/L (ref 0–44)
AST: 17 IU/L (ref 0–40)
Albumin/Globulin Ratio: 1.8 (ref 1.2–2.2)
Albumin: 4.8 g/dL (ref 4.1–5.1)
Alkaline Phosphatase: 97 IU/L (ref 44–121)
BUN/Creatinine Ratio: 15 (ref 9–20)
BUN: 14 mg/dL (ref 6–20)
Bilirubin Total: 0.6 mg/dL (ref 0.0–1.2)
CO2: 19 mmol/L — ABNORMAL LOW (ref 20–29)
Calcium: 10 mg/dL (ref 8.7–10.2)
Chloride: 100 mmol/L (ref 96–106)
Creatinine, Ser: 0.93 mg/dL (ref 0.76–1.27)
Globulin, Total: 2.6 g/dL (ref 1.5–4.5)
Glucose: 77 mg/dL (ref 70–99)
Potassium: 4.6 mmol/L (ref 3.5–5.2)
Sodium: 135 mmol/L (ref 134–144)
Total Protein: 7.4 g/dL (ref 6.0–8.5)
eGFR: 113 mL/min/{1.73_m2} (ref >59)

## 2022-02-20 LAB — LIPID PANEL
Chol/HDL Ratio: 5.1 ratio — ABNORMAL HIGH (ref 0.0–5.0)
Cholesterol, Total: 184 mg/dL (ref 100–199)
HDL: 36 mg/dL — ABNORMAL LOW (ref >39)
LDL Chol Calc (NIH): 135 mg/dL — ABNORMAL HIGH (ref 0–99)
Triglycerides: 71 mg/dL (ref 0–149)
VLDL Cholesterol Cal: 13 mg/dL (ref 5–40)

## 2022-02-23 ENCOUNTER — Other Ambulatory Visit: Payer: Self-pay

## 2023-02-21 ENCOUNTER — Encounter: Payer: 59 | Admitting: Family Medicine

## 2023-04-08 ENCOUNTER — Ambulatory Visit: Payer: 59 | Admitting: Family Medicine

## 2023-05-14 ENCOUNTER — Telehealth (INDEPENDENT_AMBULATORY_CARE_PROVIDER_SITE_OTHER): Payer: 59 | Admitting: Family Medicine

## 2023-05-14 ENCOUNTER — Encounter: Payer: Self-pay | Admitting: Family Medicine

## 2023-05-14 DIAGNOSIS — R52 Pain, unspecified: Secondary | ICD-10-CM

## 2023-05-14 DIAGNOSIS — J029 Acute pharyngitis, unspecified: Secondary | ICD-10-CM

## 2023-05-14 DIAGNOSIS — Z20828 Contact with and (suspected) exposure to other viral communicable diseases: Secondary | ICD-10-CM

## 2023-05-14 MED ORDER — OSELTAMIVIR PHOSPHATE 75 MG PO CAPS: 75.0000 mg | ORAL_CAPSULE | Freq: Two times a day (BID) | ORAL | 0 refills | Status: AC

## 2023-05-14 NOTE — Progress Notes (Signed)
 Virtual Visit via Video   I connected with patient on 05/14/23 at 1600 by a video enabled telemedicine application and verified that I am speaking with the correct person using two identifiers.  Location patient: Home Location provider: [facility] Persons participating in the virtual visit: Patient and Provider  I discussed the limitations of evaluation and management by telemedicine and the availability of in person appointments. The patient expressed understanding and agreed to proceed.  Subjective:   HPI:  Pt presents today for  Chief Complaint  Patient presents with   Influenza    Exposed, now symptomatic   Discussed the use of AI scribe software for clinical note transcription with the patient, who gave verbal consent to proceed.  History of Present Illness   Sergio Wright is a 33 year old male who presents with potential flu exposure and symptoms.  He was exposed to influenza A after spending the weekend with his girlfriend, who was diagnosed with the flu this morning. He spent all day yesterday with her, raising concerns about his own risk of infection.  He developed a dry throat around 10:00 to 10:30 AM today. He also experiences mild back pain. He is unsure if he has a fever, as he has taken medication that may have reduced it. No other symptoms such as cough, shortness of breath, or gastrointestinal issues are reported.  He has been taking over-the-counter supplements to maintain his vitamin intake. This morning, he took ibuprofen and Tylenol for back pain.       ROS per HPI   Patient Active Problem List   Diagnosis Date Noted   Gastroesophageal reflux disease without esophagitis 08/26/2020   Lumbar strain, initial encounter 12/08/2018   BMI 40.0-44.9, adult ([facility]) 05/03/2015    Social History   Tobacco Use   Smoking status: Every Day    Current packs/day: 0.25    Types: Cigarettes   Smokeless tobacco: Never  Substance Use  Topics   Alcohol use: Yes    Alcohol/week: 12.0 standard drinks of alcohol    Types: 12 Cans of beer per week    Current Outpatient Medications:    oseltamivir (TAMIFLU) 75 MG capsule, Take 1 capsule (75 mg total) by mouth 2 (two) times daily for 5 days., Disp: 10 capsule, Rfl: 0   acetaminophen (TYLENOL) 500 MG tablet, Take 1,000 mg by mouth every 6 (six) hours as needed., Disp: , Rfl:    hydrOXYzine (VISTARIL) 25 MG capsule, Take 1 capsule (25 mg total) by mouth every 8 (eight) hours as needed., Disp: 10 capsule, Rfl: 0   nystatin cream (MYCOSTATIN), Apply 1 application topically 2 (two) times daily., Disp: 60 g, Rfl: 0   phentermine (ADIPEX-P) 37.5 MG tablet, Take 37.5 mg by mouth daily., Disp: , Rfl:    Polyethylene Glycol 3350 (MIRALAX PO), Take by mouth in the morning and at bedtime., Disp: , Rfl:   No Known Allergies  Objective:   There were no vitals taken for this visit.  Patient is well-developed, well-nourished in no acute distress.  Resting comfortably at home.  Head is normocephalic, atraumatic.  No labored breathing.  Speech is clear and coherent with logical content.  Patient is alert and oriented at baseline.    Assessment and Plan:   Sergio Wright was seen today for influenza.  Diagnoses and all orders for this visit:  Exposure to influenza -     oseltamivir (TAMIFLU) 75 MG capsule; Take 1 capsule (75 mg total) by mouth  2 (two) times daily for 5 days.  Sore throat -     oseltamivir (TAMIFLU) 75 MG capsule; Take 1 capsule (75 mg total) by mouth 2 (two) times daily for 5 days.  Body aches -     oseltamivir (TAMIFLU) 75 MG capsule; Take 1 capsule (75 mg total) by mouth 2 (two) times daily for 5 days.    Assessment and Plan    Influenza A Exposure Recent exposure to Influenza A through close contact with girlfriend diagnosed this morning. Reports dry throat starting around 10:30 AM today. No fever, likely due to antipyretic effects of ibuprofen and Tylenol taken  for back pain. Considering post-exposure prophylaxis with Tamiflu to prevent symptom onset and viral replication. Discussed risks of Tamiflu including nausea, vomiting, bad dreams, and diarrhea. Emphasized completing the full course if tolerated to prevent severe illness. - Prescribe Tamiflu 75 mg twice daily for 5 days - Advise to stop Tamiflu if significant side effects occur - Recommend continued use of Tylenol and ibuprofen for symptom relief - Encourage increased fluid intake and rest - Send prescription to [facility] in [place] - Advise to report any worsening or new symptoms.        Return if symptoms worsen or fail to improve.  Kari Baars, FNP-C [facility] [address] [place], [place] [address] [phone]  05/14/2023  Time spent with the patient: 12 minutes, of which >50% was spent in obtaining information about symptoms, reviewing previous labs, evaluations, and treatments, counseling about condition (please see the discussed topics above), and developing a plan to further investigate it; had a number of questions which I addressed.
